# Patient Record
Sex: Female | Born: 1950 | Race: White | Hispanic: No | Marital: Married | Smoking: Never smoker
Health system: Southern US, Community
[De-identification: ages and names within clinical notes are randomized; demographics above are authoritative.]

## PROBLEM LIST (undated history)

## (undated) DIAGNOSIS — R141 Gas pain: Secondary | ICD-10-CM

## (undated) HISTORY — DX: Gas pain: R14.1

---

## 2004-12-21 ENCOUNTER — Ambulatory Visit (HOSPITAL_COMMUNITY): Admission: RE | Admit: 2004-12-21 | Discharge: 2004-12-21 | Payer: Self-pay | Admitting: Family Medicine

## 2005-03-16 ENCOUNTER — Encounter (INDEPENDENT_AMBULATORY_CARE_PROVIDER_SITE_OTHER): Payer: Self-pay | Admitting: *Deleted

## 2005-03-16 ENCOUNTER — Ambulatory Visit (HOSPITAL_COMMUNITY): Admission: RE | Admit: 2005-03-16 | Discharge: 2005-03-16 | Payer: Self-pay | Admitting: *Deleted

## 2006-03-28 ENCOUNTER — Ambulatory Visit (HOSPITAL_COMMUNITY): Admission: RE | Admit: 2006-03-28 | Discharge: 2006-03-28 | Payer: Self-pay | Admitting: Physician Assistant

## 2010-10-28 NOTE — Op Note (Signed)
Monique Patterson, Monique Patterson           ACCOUNT NO.:  [id number]   MEDICAL RECORD NO.:  [id number]          PATIENT TYPE:  AMB   LOCATION:  SDC                           FACILITY:  [facility]   PHYSICIAN:  Cornelia B. Earlene Plater, M.D.  DATE OF BIRTH:  [birth year]   DATE OF PROCEDURE:  03/16/2005  DATE OF DISCHARGE:                                 OPERATIVE REPORT   PREOPERATIVE DIAGNOSES:  Abnormal uterine bleeding, submucous myoma.   POSTOPERATIVE DIAGNOSIS:  Abnormal uterine bleeding, submucous myoma.   PROCEDURE:  Hysteroscopic resection of submucous myoma.   SURGEON:  Chester Holstein. Earlene Plater, M.D.   ASSISTANT:  None.   ANESTHESIA:  MAC and 10 mL of 1% Nesacaine paracervical block.   SPECIMENS:  Submucous fibroid.   ESTIMATED BLOOD LOSS:  Minimal.   FLUID DEFICIT:  80 mL sorbitol.   COMPLICATIONS:  None.   FINDINGS:  Approximately 1.5 cm submucous myoma, otherwise normal appearing  cavity.   INDICATIONS FOR PROCEDURE:  A patient with a history of heavy and irregular  bleeding. Ultrasound showed a focal endometrial mass. Sonohysterogram showed  a well circumscribed submucous myoma. The patient was advised of the risks  of surgery including infection, bleeding, uterine perforation, injury to  surrounding organs and fluid overload.   DESCRIPTION OF PROCEDURE:  The patient was taken to the operating room and  MAC anesthesia obtained. She was placed in the [place] stirrups, prepped and  draped in standard fashion and bladder emptied with in and out catheter.  Exam under anesthesia showed an anteverted normal size uterus with no  adnexal masses.   Speculum inserted, paracervical block placed with 10 mL 1% Nesacaine. The  anterior lip of the cervix grasped with a single tooth. The cervix was  easily dilated to a #31.   The resectoscope was inserted into the cavity after being flushed with  sorbitol. Good uterine distention was obtained. Inspection of the cavity  showed findings as outlined  above.   The submucous myoma appeared to be emanating from the anterior and right  side of the fundus. it was resected with the single loop resectoscope to its  base. Hemostasis obtained. No other intracavitary lesion was seen.   Instruments were removed and cervix hemostatic.   The patient tolerated the procedure well with no complications.      Gerri Spore B. Earlene Plater, M.D.  Electronically Signed     WBD/MEDQ  D:  03/16/2005  T:  03/16/2005  Job:  [id number]

## 2010-10-28 NOTE — Op Note (Signed)
NAMEALLANI, Monique Patterson           ACCOUNT NO.:  [id number]   MEDICAL RECORD NO.:  [id number]          PATIENT TYPE:  AMB   LOCATION:  ENDO                         FACILITY:  [facility]   PHYSICIAN:  Anselmo Rod, M.D.  DATE OF BIRTH:  11-Apr-1951   DATE OF PROCEDURE:  03/29/2006  DATE OF DISCHARGE:  03/28/2006                                 OPERATIVE REPORT   PROCEDURE:  Screening colonoscopy.   ENDOSCOPIST:  Anselmo Rod, M.D.   INSTRUMENT:  Olympus video colonoscope.   INDICATIONS FOR PROCEDURE:  A 60 year old white female with a family history  of colon cancer in a maternal aunt and a life-long history of chronic  constipation with occasional rectal bleeding undergoing a screening  colonoscopy to rule out colonic polyps, masses, etc.   PROCEDURE PREPARATION:  Informed consent was procured from the patient.  The  patient fasted for 8 hours prior to the procedure and prepped with Dulcolax  pills and a gallon of TriLyte the night prior to the procedure.  The risks  and benefits of the procedure including a 10% miss rate of cancer and polyps  were discussed with her as well.   PRE-PROCEDURE PHYSICAL:  The patient had stable vital signs.  NECK:  Supple.  CHEST:  Clear to auscultation.  S1, S2 regular.  ABDOMEN:  Soft with normal bowel sounds.   DESCRIPTION OF PROCEDURE:  The patient was placed in left lateral decubitus  position and sedated with 100 mcg fentanyl and 10 mg of Versed in slow  incremental doses.  Once the patient was adequately sedated and maintained  on low-flow oxygen and continuous cardiac monitoring, the Olympus video  colonoscope was advanced from the rectum to the cecum.  There was some  residual stool in the colon.  Multiple washes were done.  No masses, polyps,  erosions, ulcerations or diverticula were seen.  The appendiceal orifice and  ileocecal valve were visualized and photographed.  No masses, polyps,  erosions, ulcerations or diverticula were  noted.  Retroflexion in the rectum  revealed prominent internal hemorrhoids.  The patient tolerated the  procedure well without immediate complications.   IMPRESSION:  1. Prominent internal and external hemorrhoids seen on retroflexion and      inspection respectively.  Otherwise, normal exam up to the cecum.  2. No masses, polyps or diverticula noted.   RECOMMENDATIONS:  1. Continue on high fiber diet and liberal fluid intake.  Use Colace 100-      200 mg at bedtime as needed.  2. Repeat colonoscopy in the next 5 years as the patient has a family      history of colon cancer.  3. Outpatient followup as need arises in the future.  If the patient's      rectal bleeding persists, she should contact the office within the next      2 weeks.      Anselmo Rod, M.D.  Electronically Signed     JNM/MEDQ  D:  03/29/2006  T:  03/30/2006  Job:  [id number]   cc:   Gerri Spore B. Earlene Plater, M.D.

## 2017-02-19 DIAGNOSIS — K219 Gastro-esophageal reflux disease without esophagitis: Secondary | ICD-10-CM

## 2017-02-19 DIAGNOSIS — R1312 Dysphagia, oropharyngeal phase: Secondary | ICD-10-CM

## 2017-02-19 HISTORY — DX: Gastro-esophageal reflux disease without esophagitis: K21.9

## 2017-02-19 HISTORY — DX: Dysphagia, oropharyngeal phase: R13.12

## 2017-04-02 ENCOUNTER — Ambulatory Visit
Admission: RE | Admit: 2017-04-02 | Discharge: 2017-04-02 | Disposition: A | Discharge status: Home / Self Care | Payer: Medicare Other | Source: Ambulatory Visit | Attending: Family Medicine | Admitting: Family Medicine

## 2017-04-02 ENCOUNTER — Other Ambulatory Visit: Payer: Self-pay | Admitting: Family Medicine

## 2017-04-02 DIAGNOSIS — M542 Cervicalgia: Secondary | ICD-10-CM

## 2017-04-27 DIAGNOSIS — M542 Cervicalgia: Secondary | ICD-10-CM | POA: Insufficient documentation

## 2017-04-27 DIAGNOSIS — Z6827 Body mass index (BMI) 27.0-27.9, adult: Secondary | ICD-10-CM | POA: Insufficient documentation

## 2017-04-27 DIAGNOSIS — R030 Elevated blood-pressure reading, without diagnosis of hypertension: Secondary | ICD-10-CM

## 2017-04-27 HISTORY — DX: Elevated blood-pressure reading, without diagnosis of hypertension: R03.0

## 2017-04-27 HISTORY — DX: Cervicalgia: M54.2

## 2018-07-16 DIAGNOSIS — J3501 Chronic tonsillitis: Secondary | ICD-10-CM

## 2018-07-16 HISTORY — DX: Chronic tonsillitis: J35.01

## 2019-04-17 ENCOUNTER — Other Ambulatory Visit: Payer: Self-pay

## 2019-04-17 DIAGNOSIS — Z20822 Contact with and (suspected) exposure to covid-19: Secondary | ICD-10-CM

## 2019-04-18 LAB — NOVEL CORONAVIRUS, NAA: SARS-CoV-2, NAA: NOT DETECTED

## 2019-09-08 ENCOUNTER — Telehealth: Payer: Self-pay

## 2019-09-08 NOTE — Telephone Encounter (Signed)
Pt called to cancel both appts on 09/29/19 & fu appt on 10/06/19 [place]. Pt states if she decides to continue she will call to reschedule.

## 2019-09-29 ENCOUNTER — Encounter: Payer: Medicare Other | Admitting: Psychology

## 2019-10-01 DIAGNOSIS — Z1231 Encounter for screening mammogram for malignant neoplasm of breast: Secondary | ICD-10-CM | POA: Diagnosis not present

## 2019-10-06 ENCOUNTER — Encounter: Payer: Medicare Other | Admitting: Psychology

## 2019-11-13 DIAGNOSIS — E782 Mixed hyperlipidemia: Secondary | ICD-10-CM | POA: Diagnosis not present

## 2019-11-13 DIAGNOSIS — R413 Other amnesia: Secondary | ICD-10-CM | POA: Diagnosis not present

## 2019-11-13 DIAGNOSIS — Z0000 Encounter for general adult medical examination without abnormal findings: Secondary | ICD-10-CM | POA: Diagnosis not present

## 2019-11-13 DIAGNOSIS — K5909 Other constipation: Secondary | ICD-10-CM | POA: Diagnosis not present

## 2019-11-17 DIAGNOSIS — E782 Mixed hyperlipidemia: Secondary | ICD-10-CM | POA: Diagnosis not present

## 2019-11-17 DIAGNOSIS — R413 Other amnesia: Secondary | ICD-10-CM | POA: Diagnosis not present

## 2019-11-17 DIAGNOSIS — H9190 Unspecified hearing loss, unspecified ear: Secondary | ICD-10-CM | POA: Diagnosis not present

## 2019-11-20 ENCOUNTER — Encounter: Payer: Self-pay | Admitting: Psychology

## 2019-12-22 ENCOUNTER — Encounter: Payer: Medicare Other | Admitting: Psychology

## 2019-12-22 DIAGNOSIS — R413 Other amnesia: Secondary | ICD-10-CM | POA: Diagnosis not present

## 2019-12-30 ENCOUNTER — Encounter: Payer: Medicare Other | Admitting: Psychology

## 2020-01-01 DIAGNOSIS — F3342 Major depressive disorder, recurrent, in full remission: Secondary | ICD-10-CM | POA: Diagnosis not present

## 2020-01-01 DIAGNOSIS — Z6827 Body mass index (BMI) 27.0-27.9, adult: Secondary | ICD-10-CM | POA: Diagnosis not present

## 2020-01-01 DIAGNOSIS — R413 Other amnesia: Secondary | ICD-10-CM | POA: Diagnosis not present

## 2020-01-08 DIAGNOSIS — M5136 Other intervertebral disc degeneration, lumbar region: Secondary | ICD-10-CM | POA: Diagnosis not present

## 2020-01-08 DIAGNOSIS — M9903 Segmental and somatic dysfunction of lumbar region: Secondary | ICD-10-CM | POA: Diagnosis not present

## 2020-01-12 DIAGNOSIS — M5136 Other intervertebral disc degeneration, lumbar region: Secondary | ICD-10-CM | POA: Diagnosis not present

## 2020-01-12 DIAGNOSIS — M9903 Segmental and somatic dysfunction of lumbar region: Secondary | ICD-10-CM | POA: Diagnosis not present

## 2020-01-13 DIAGNOSIS — M5136 Other intervertebral disc degeneration, lumbar region: Secondary | ICD-10-CM | POA: Diagnosis not present

## 2020-01-13 DIAGNOSIS — M9903 Segmental and somatic dysfunction of lumbar region: Secondary | ICD-10-CM | POA: Diagnosis not present

## 2020-01-19 DIAGNOSIS — M9903 Segmental and somatic dysfunction of lumbar region: Secondary | ICD-10-CM | POA: Diagnosis not present

## 2020-01-19 DIAGNOSIS — M5136 Other intervertebral disc degeneration, lumbar region: Secondary | ICD-10-CM | POA: Diagnosis not present

## 2020-01-20 DIAGNOSIS — H7291 Unspecified perforation of tympanic membrane, right ear: Secondary | ICD-10-CM | POA: Diagnosis not present

## 2020-01-20 DIAGNOSIS — Z77122 Contact with and (suspected) exposure to noise: Secondary | ICD-10-CM | POA: Diagnosis not present

## 2020-01-20 DIAGNOSIS — Z822 Family history of deafness and hearing loss: Secondary | ICD-10-CM | POA: Diagnosis not present

## 2020-01-20 DIAGNOSIS — H903 Sensorineural hearing loss, bilateral: Secondary | ICD-10-CM | POA: Diagnosis not present

## 2020-01-21 DIAGNOSIS — M5136 Other intervertebral disc degeneration, lumbar region: Secondary | ICD-10-CM | POA: Diagnosis not present

## 2020-01-21 DIAGNOSIS — M9903 Segmental and somatic dysfunction of lumbar region: Secondary | ICD-10-CM | POA: Diagnosis not present

## 2020-01-26 DIAGNOSIS — M9903 Segmental and somatic dysfunction of lumbar region: Secondary | ICD-10-CM | POA: Diagnosis not present

## 2020-01-26 DIAGNOSIS — M5136 Other intervertebral disc degeneration, lumbar region: Secondary | ICD-10-CM | POA: Diagnosis not present

## 2020-02-02 DIAGNOSIS — M9903 Segmental and somatic dysfunction of lumbar region: Secondary | ICD-10-CM | POA: Diagnosis not present

## 2020-02-02 DIAGNOSIS — M5136 Other intervertebral disc degeneration, lumbar region: Secondary | ICD-10-CM | POA: Diagnosis not present

## 2020-02-09 DIAGNOSIS — M9903 Segmental and somatic dysfunction of lumbar region: Secondary | ICD-10-CM | POA: Diagnosis not present

## 2020-02-09 DIAGNOSIS — M5136 Other intervertebral disc degeneration, lumbar region: Secondary | ICD-10-CM | POA: Diagnosis not present

## 2020-02-17 DIAGNOSIS — R413 Other amnesia: Secondary | ICD-10-CM | POA: Diagnosis not present

## 2020-02-17 DIAGNOSIS — Z23 Encounter for immunization: Secondary | ICD-10-CM | POA: Diagnosis not present

## 2020-02-17 DIAGNOSIS — F419 Anxiety disorder, unspecified: Secondary | ICD-10-CM | POA: Diagnosis not present

## 2020-02-17 DIAGNOSIS — F3342 Major depressive disorder, recurrent, in full remission: Secondary | ICD-10-CM | POA: Diagnosis not present

## 2020-03-04 DIAGNOSIS — R413 Other amnesia: Secondary | ICD-10-CM | POA: Diagnosis not present

## 2020-03-04 DIAGNOSIS — Z6826 Body mass index (BMI) 26.0-26.9, adult: Secondary | ICD-10-CM | POA: Diagnosis not present

## 2020-03-04 DIAGNOSIS — F3342 Major depressive disorder, recurrent, in full remission: Secondary | ICD-10-CM | POA: Diagnosis not present

## 2020-03-25 DIAGNOSIS — H903 Sensorineural hearing loss, bilateral: Secondary | ICD-10-CM | POA: Diagnosis not present

## 2020-04-07 DIAGNOSIS — Z6827 Body mass index (BMI) 27.0-27.9, adult: Secondary | ICD-10-CM | POA: Diagnosis not present

## 2020-04-07 DIAGNOSIS — R413 Other amnesia: Secondary | ICD-10-CM | POA: Diagnosis not present

## 2020-04-07 DIAGNOSIS — F3342 Major depressive disorder, recurrent, in full remission: Secondary | ICD-10-CM | POA: Diagnosis not present

## 2020-05-12 DIAGNOSIS — F3342 Major depressive disorder, recurrent, in full remission: Secondary | ICD-10-CM | POA: Diagnosis not present

## 2020-05-12 DIAGNOSIS — R413 Other amnesia: Secondary | ICD-10-CM | POA: Diagnosis not present

## 2020-05-12 DIAGNOSIS — Z6827 Body mass index (BMI) 27.0-27.9, adult: Secondary | ICD-10-CM | POA: Diagnosis not present

## 2020-07-26 DIAGNOSIS — H9190 Unspecified hearing loss, unspecified ear: Secondary | ICD-10-CM | POA: Diagnosis not present

## 2020-07-26 DIAGNOSIS — Z6826 Body mass index (BMI) 26.0-26.9, adult: Secondary | ICD-10-CM | POA: Diagnosis not present

## 2020-07-26 DIAGNOSIS — Z79899 Other long term (current) drug therapy: Secondary | ICD-10-CM | POA: Diagnosis not present

## 2020-07-26 DIAGNOSIS — E559 Vitamin D deficiency, unspecified: Secondary | ICD-10-CM | POA: Diagnosis not present

## 2020-07-26 DIAGNOSIS — F3342 Major depressive disorder, recurrent, in full remission: Secondary | ICD-10-CM | POA: Diagnosis not present

## 2020-07-26 DIAGNOSIS — R413 Other amnesia: Secondary | ICD-10-CM | POA: Diagnosis not present

## 2020-07-26 DIAGNOSIS — E782 Mixed hyperlipidemia: Secondary | ICD-10-CM | POA: Diagnosis not present

## 2020-07-30 DIAGNOSIS — Z0000 Encounter for general adult medical examination without abnormal findings: Secondary | ICD-10-CM | POA: Diagnosis not present

## 2020-07-30 DIAGNOSIS — Z1389 Encounter for screening for other disorder: Secondary | ICD-10-CM | POA: Diagnosis not present

## 2020-08-05 DIAGNOSIS — E559 Vitamin D deficiency, unspecified: Secondary | ICD-10-CM | POA: Diagnosis not present

## 2020-08-05 DIAGNOSIS — Z6827 Body mass index (BMI) 27.0-27.9, adult: Secondary | ICD-10-CM | POA: Diagnosis not present

## 2020-08-05 DIAGNOSIS — F3342 Major depressive disorder, recurrent, in full remission: Secondary | ICD-10-CM | POA: Diagnosis not present

## 2020-08-05 DIAGNOSIS — R413 Other amnesia: Secondary | ICD-10-CM | POA: Diagnosis not present

## 2020-08-05 DIAGNOSIS — N1831 Chronic kidney disease, stage 3a: Secondary | ICD-10-CM | POA: Diagnosis not present

## 2020-10-21 DIAGNOSIS — H5203 Hypermetropia, bilateral: Secondary | ICD-10-CM | POA: Diagnosis not present

## 2020-10-21 DIAGNOSIS — H2513 Age-related nuclear cataract, bilateral: Secondary | ICD-10-CM | POA: Diagnosis not present

## 2020-12-03 DIAGNOSIS — H9190 Unspecified hearing loss, unspecified ear: Secondary | ICD-10-CM | POA: Insufficient documentation

## 2020-12-03 DIAGNOSIS — F419 Anxiety disorder, unspecified: Secondary | ICD-10-CM | POA: Insufficient documentation

## 2020-12-03 DIAGNOSIS — K5100 Ulcerative (chronic) pancolitis without complications: Secondary | ICD-10-CM

## 2020-12-03 DIAGNOSIS — F3342 Major depressive disorder, recurrent, in full remission: Secondary | ICD-10-CM

## 2020-12-03 DIAGNOSIS — F411 Generalized anxiety disorder: Secondary | ICD-10-CM | POA: Insufficient documentation

## 2020-12-03 DIAGNOSIS — E782 Mixed hyperlipidemia: Secondary | ICD-10-CM | POA: Insufficient documentation

## 2020-12-03 DIAGNOSIS — E559 Vitamin D deficiency, unspecified: Secondary | ICD-10-CM

## 2020-12-03 DIAGNOSIS — M47812 Spondylosis without myelopathy or radiculopathy, cervical region: Secondary | ICD-10-CM

## 2020-12-03 DIAGNOSIS — N1831 Chronic kidney disease, stage 3a: Secondary | ICD-10-CM

## 2020-12-03 DIAGNOSIS — K5900 Constipation, unspecified: Secondary | ICD-10-CM | POA: Insufficient documentation

## 2020-12-03 HISTORY — DX: Mixed hyperlipidemia: E78.2

## 2020-12-03 HISTORY — DX: Ulcerative (chronic) pancolitis without complications: K51.00

## 2020-12-03 HISTORY — DX: Major depressive disorder, recurrent, in full remission: F33.42

## 2020-12-03 HISTORY — DX: Spondylosis without myelopathy or radiculopathy, cervical region: M47.812

## 2020-12-03 HISTORY — DX: Unspecified hearing loss, unspecified ear: H91.90

## 2020-12-03 HISTORY — DX: Chronic kidney disease, stage 3a: N18.31

## 2020-12-03 HISTORY — DX: Vitamin D deficiency, unspecified: E55.9

## 2021-01-11 DIAGNOSIS — S0990XA Unspecified injury of head, initial encounter: Secondary | ICD-10-CM | POA: Diagnosis not present

## 2021-01-14 ENCOUNTER — Other Ambulatory Visit: Payer: Self-pay | Admitting: Family Medicine

## 2021-01-14 DIAGNOSIS — S0990XA Unspecified injury of head, initial encounter: Secondary | ICD-10-CM

## 2021-02-01 ENCOUNTER — Ambulatory Visit
Admission: RE | Admit: 2021-02-01 | Discharge: 2021-02-01 | Disposition: A | Discharge status: Home / Self Care | Payer: Medicare PPO | Source: Ambulatory Visit | Attending: Family Medicine | Admitting: Family Medicine

## 2021-02-01 DIAGNOSIS — R519 Headache, unspecified: Secondary | ICD-10-CM | POA: Diagnosis not present

## 2021-02-01 DIAGNOSIS — S0990XA Unspecified injury of head, initial encounter: Secondary | ICD-10-CM

## 2021-02-15 DIAGNOSIS — R21 Rash and other nonspecific skin eruption: Secondary | ICD-10-CM | POA: Diagnosis not present

## 2021-03-23 DIAGNOSIS — M546 Pain in thoracic spine: Secondary | ICD-10-CM | POA: Diagnosis not present

## 2021-04-06 DIAGNOSIS — R519 Headache, unspecified: Secondary | ICD-10-CM | POA: Diagnosis not present

## 2021-04-18 DIAGNOSIS — M546 Pain in thoracic spine: Secondary | ICD-10-CM | POA: Diagnosis not present

## 2021-04-18 DIAGNOSIS — M542 Cervicalgia: Secondary | ICD-10-CM | POA: Diagnosis not present

## 2021-04-18 DIAGNOSIS — M549 Dorsalgia, unspecified: Secondary | ICD-10-CM | POA: Diagnosis not present

## 2021-04-18 DIAGNOSIS — R413 Other amnesia: Secondary | ICD-10-CM | POA: Diagnosis not present

## 2021-04-27 DIAGNOSIS — H905 Unspecified sensorineural hearing loss: Secondary | ICD-10-CM | POA: Diagnosis not present

## 2021-04-27 DIAGNOSIS — H9071 Mixed conductive and sensorineural hearing loss, unilateral, right ear, with unrestricted hearing on the contralateral side: Secondary | ICD-10-CM | POA: Diagnosis not present

## 2021-08-16 DIAGNOSIS — F419 Anxiety disorder, unspecified: Secondary | ICD-10-CM | POA: Diagnosis not present

## 2021-08-16 DIAGNOSIS — E559 Vitamin D deficiency, unspecified: Secondary | ICD-10-CM | POA: Diagnosis not present

## 2021-08-16 DIAGNOSIS — E782 Mixed hyperlipidemia: Secondary | ICD-10-CM | POA: Diagnosis not present

## 2021-08-16 DIAGNOSIS — Z0000 Encounter for general adult medical examination without abnormal findings: Secondary | ICD-10-CM | POA: Diagnosis not present

## 2021-08-16 DIAGNOSIS — N1831 Chronic kidney disease, stage 3a: Secondary | ICD-10-CM | POA: Diagnosis not present

## 2021-08-16 DIAGNOSIS — Z1389 Encounter for screening for other disorder: Secondary | ICD-10-CM | POA: Diagnosis not present

## 2021-08-22 DIAGNOSIS — E782 Mixed hyperlipidemia: Secondary | ICD-10-CM | POA: Diagnosis not present

## 2021-08-22 DIAGNOSIS — F0390 Unspecified dementia without behavioral disturbance: Secondary | ICD-10-CM | POA: Diagnosis not present

## 2021-08-22 DIAGNOSIS — Z23 Encounter for immunization: Secondary | ICD-10-CM | POA: Diagnosis not present

## 2021-08-22 DIAGNOSIS — F419 Anxiety disorder, unspecified: Secondary | ICD-10-CM | POA: Diagnosis not present

## 2021-08-22 DIAGNOSIS — R413 Other amnesia: Secondary | ICD-10-CM | POA: Diagnosis not present

## 2021-10-27 DIAGNOSIS — H2513 Age-related nuclear cataract, bilateral: Secondary | ICD-10-CM | POA: Diagnosis not present

## 2021-10-27 DIAGNOSIS — L821 Other seborrheic keratosis: Secondary | ICD-10-CM | POA: Diagnosis not present

## 2021-10-27 DIAGNOSIS — D225 Melanocytic nevi of trunk: Secondary | ICD-10-CM | POA: Diagnosis not present

## 2021-10-27 DIAGNOSIS — H5203 Hypermetropia, bilateral: Secondary | ICD-10-CM | POA: Diagnosis not present

## 2021-10-27 DIAGNOSIS — D485 Neoplasm of uncertain behavior of skin: Secondary | ICD-10-CM | POA: Diagnosis not present

## 2021-10-27 DIAGNOSIS — H524 Presbyopia: Secondary | ICD-10-CM | POA: Diagnosis not present

## 2021-11-16 DIAGNOSIS — L814 Other melanin hyperpigmentation: Secondary | ICD-10-CM | POA: Diagnosis not present

## 2021-11-16 DIAGNOSIS — L821 Other seborrheic keratosis: Secondary | ICD-10-CM | POA: Diagnosis not present

## 2021-11-16 DIAGNOSIS — D1801 Hemangioma of skin and subcutaneous tissue: Secondary | ICD-10-CM | POA: Diagnosis not present

## 2021-12-26 DIAGNOSIS — F3342 Major depressive disorder, recurrent, in full remission: Secondary | ICD-10-CM | POA: Diagnosis not present

## 2021-12-26 DIAGNOSIS — E782 Mixed hyperlipidemia: Secondary | ICD-10-CM | POA: Diagnosis not present

## 2021-12-26 DIAGNOSIS — F0390 Unspecified dementia without behavioral disturbance: Secondary | ICD-10-CM | POA: Diagnosis not present

## 2022-03-22 DIAGNOSIS — R1013 Epigastric pain: Secondary | ICD-10-CM | POA: Diagnosis not present

## 2022-04-19 ENCOUNTER — Ambulatory Visit: Payer: Medicare PPO | Admitting: Physician Assistant

## 2022-04-19 ENCOUNTER — Encounter: Payer: Self-pay | Admitting: Physician Assistant

## 2022-04-19 VITALS — BP 145/82 | HR 90 | Resp 18 | Ht 64.0 in | Wt 143.0 lb

## 2022-04-19 DIAGNOSIS — G3184 Mild cognitive impairment, so stated: Secondary | ICD-10-CM

## 2022-04-19 DIAGNOSIS — R413 Other amnesia: Secondary | ICD-10-CM | POA: Diagnosis not present

## 2022-04-19 NOTE — Patient Instructions (Addendum)
It was a pleasure to see you today at our office.   Recommendations:  Neurocognitive evaluation at our office MRI of the brain, the radiology office will call you to arrange you appointment Recommend Psychotherapy   Follow up in 1 year November 5 at 11:30   Whom to call:  Memory  decline, memory medications: Call our office [phone]   For psychiatric meds, mood meds: Please have your primary care physician manage these medications.   Counseling regarding caregiver distress, including caregiver depression, anxiety and issues regarding community resources, adult day care programs, adult living facilities, or memory care questions:   Feel free to contact Misty Lisabeth Register, Social Worker at [phone]   For assessment of decision of mental capacity and competency:  Call Dr. Erick Blinks, geriatric psychiatrist at [phone]  For guidance in geriatric dementia issues please call [facility] [phone]  For guidance regarding [facility] and if placement were needed at the facility, contact Sidney Ace, Social Worker tel: [phone]  If you have any severe symptoms of a stroke, or other severe issues such as confusion,severe chills or fever, etc call 911 or go to the ER as you may need to be evaluated further   Feel free to visit Facebook page " [email or web address]" for tips of how to care for people with memory problems.      RECOMMENDATIONS FOR ALL PATIENTS WITH MEMORY PROBLEMS: 1. Continue to exercise (Recommend 30 minutes of walking everyday, or 3 hours every week) 2. Increase social interactions - continue going to [place] and enjoy social gatherings with friends and family 3. Eat healthy, avoid fried foods and eat more fruits and vegetables 4. Maintain adequate blood pressure, blood sugar, and blood cholesterol level. Reducing the risk of stroke and cardiovascular disease also helps promoting better memory. 5. Avoid stressful situations. Live a  simple life and avoid aggravations. Organize your time and prepare for the next day in anticipation. 6. Sleep well, avoid any interruptions of sleep and avoid any distractions in the bedroom that may interfere with adequate sleep quality 7. Avoid sugar, avoid sweets as there is a strong link between excessive sugar intake, diabetes, and cognitive impairment We discussed the Mediterranean diet, which has been shown to help patients reduce the risk of progressive memory disorders and reduces cardiovascular risk. This includes eating fish, eat fruits and green leafy vegetables, nuts like almonds and hazelnuts, walnuts, and also use olive oil. Avoid fast foods and fried foods as much as possible. Avoid sweets and sugar as sugar use has been linked to worsening of memory function.  There is always a concern of gradual progression of memory problems. If this is the case, then we may need to adjust level of care according to patient needs. Support, both to the patient and caregiver, should then be put into place.      You have been referred for a neuropsychological evaluation (i.e., evaluation of memory and thinking abilities). Please bring someone with you to this appointment if possible, as it is helpful for the doctor to hear from both you and another adult who knows you well. Please bring eyeglasses and hearing aids if you wear them.    The evaluation will take approximately 3 hours and has two parts:   The first part is a clinical interview with the neuropsychologist (Dr. Milbert Coulter or Dr. Roseanne Reno). During the interview, the neuropsychologist will speak with you and the individual you brought to the appointment.    The second part  of the evaluation is testing with the doctor's technician Hinton Dyer or Maudie Mercury). During the testing, the technician will ask you to remember different types of material, solve problems, and answer some questionnaires. Your family member will not be present for this portion of the  evaluation.   Please note: We must reserve several hours of the neuropsychologist's time and the psychometrician's time for your evaluation appointment. As such, there is a No-Show fee of $100. If you are unable to attend any of your appointments, please contact our office as soon as possible to reschedule.    FALL PRECAUTIONS: Be cautious when walking. Scan the area for obstacles that may increase the risk of trips and falls. When getting up in the mornings, sit up at the edge of the bed for a few minutes before getting out of bed. Consider elevating the bed at the head end to avoid drop of blood pressure when getting up. Walk always in a well-lit room (use night lights in the walls). Avoid area rugs or power cords from appliances in the middle of the walkways. Use a walker or a cane if necessary and consider physical therapy for balance exercise. Get your eyesight checked regularly.  FINANCIAL OVERSIGHT: Supervision, especially oversight when making financial decisions or transactions is also recommended.  HOME SAFETY: Consider the safety of the kitchen when operating appliances like stoves, microwave oven, and blender. Consider having supervision and share cooking responsibilities until no longer able to participate in those. Accidents with firearms and other hazards in the house should be identified and addressed as well.   ABILITY TO BE LEFT ALONE: If patient is unable to contact 911 operator, consider using LifeLine, or when the need is there, arrange for someone to stay with patients. Smoking is a fire hazard, consider supervision or cessation. Risk of wandering should be assessed by caregiver and if detected at any point, supervision and safe proof recommendations should be instituted.  MEDICATION SUPERVISION: Inability to self-administer medication needs to be constantly addressed. Implement a mechanism to ensure safe administration of the medications.   DRIVING: Regarding driving, in  patients with progressive memory problems, driving will be impaired. We advise to have someone else do the driving if trouble finding directions or if minor accidents are reported. Independent driving assessment is available to determine safety of driving.   If you are interested in the driving assessment, you can contact the following:  [employer] in [place]  [place] [phone] or [phone]    [place] refers to food and lifestyle choices that are based on the traditions of countries located on the [employer]. This way of eating has been shown to help prevent certain conditions and improve outcomes for people who have chronic diseases, like kidney disease and heart disease. What are tips for following this plan? Lifestyle  Cook and eat meals together with your family, when possible. Drink enough fluid to keep your urine clear or pale yellow. Be physically active every day. This includes: Aerobic exercise like running or swimming. Leisure activities like gardening, walking, or housework. Get 7-8 hours of sleep each night. If recommended by your health care provider, drink red wine in moderation. This means 1 glass a day for nonpregnant women and 2 glasses a day for men. A glass of wine equals 5 oz (150 mL). Reading food labels  Check the serving size of packaged foods. For foods such as  rice and pasta, the serving size refers to the amount of cooked product, not dry. Check the total fat in packaged foods. Avoid foods that have saturated fat or trans fats. Check the ingredients list for added sugars, such as corn syrup. Shopping  At the grocery store, buy most of your food from the areas near the walls of the store. This includes: Fresh fruits and vegetables (produce). Grains, beans, nuts, and seeds. Some of these may be  available in unpackaged forms or large amounts (in bulk). Fresh seafood. Poultry and eggs. Low-fat dairy products. Buy whole ingredients instead of prepackaged foods. Buy fresh fruits and vegetables in-season from local farmers markets. Buy frozen fruits and vegetables in resealable bags. If you do not have access to quality fresh seafood, buy precooked frozen shrimp or canned fish, such as tuna, salmon, or sardines. Buy small amounts of raw or cooked vegetables, salads, or olives from the deli or salad bar at your store. Stock your pantry so you always have certain foods on hand, such as olive oil, canned tuna, canned tomatoes, rice, pasta, and beans. Cooking  Cook foods with extra-virgin olive oil instead of using butter or other vegetable oils. Have meat as a side dish, and have vegetables or grains as your main dish. This means having meat in small portions or adding small amounts of meat to foods like pasta or stew. Use beans or vegetables instead of meat in common dishes like chili or lasagna. Experiment with different cooking methods. Try roasting or broiling vegetables instead of steaming or sauteing them. Add frozen vegetables to soups, stews, pasta, or rice. Add nuts or seeds for added healthy fat at each meal. You can add these to yogurt, salads, or vegetable dishes. Marinate fish or vegetables using olive oil, lemon juice, garlic, and fresh herbs. Meal planning  Plan to eat 1 vegetarian meal one day each week. Try to work up to 2 vegetarian meals, if possible. Eat seafood 2 or more times a week. Have healthy snacks readily available, such as: Vegetable sticks with hummus. Greek yogurt. Fruit and nut trail mix. Eat balanced meals throughout the week. This includes: Fruit: 2-3 servings a day Vegetables: 4-5 servings a day Low-fat dairy: 2 servings a day Fish, poultry, or lean meat: 1 serving a day Beans and legumes: 2 or more servings a week Nuts and seeds: 1-2 servings a  day Whole grains: 6-8 servings a day Extra-virgin olive oil: 3-4 servings a day Limit red meat and sweets to only a few servings a month What are my food choices? Mediterranean diet Recommended Grains: Whole-grain pasta. Brown rice. Bulgar wheat. Polenta. Couscous. Whole-wheat bread. [place]. Vegetables: Artichokes. Beets. Broccoli. Cabbage. Carrots. Eggplant. Green beans. Chard. Kale. Spinach. Onions. Leeks. Peas. Squash. Tomatoes. Peppers. Radishes. Fruits: Apples. Apricots. Avocado. Berries. Bananas. Cherries. Dates. Figs. Grapes. Lemons. Melon. Oranges. Peaches. Plums. Pomegranate. Meats and other protein foods: Beans. Almonds. Sunflower seeds. Pine nuts. Peanuts. [place]. Salmon. Scallops. Shrimp. [place]. Tilapia. Clams. Oysters. Eggs. Dairy: Low-fat milk. Cheese. Greek yogurt. Beverages: Water. Red wine. Herbal tea. Fats and oils: Extra virgin olive oil. Avocado oil. Grape seed oil. Sweets and desserts: [place] yogurt with honey. Baked apples. Poached pears. Trail mix. Seasoning and other foods: Basil. Cilantro. Coriander. Cumin. Mint. Parsley. Sage. Rosemary. Tarragon. Garlic. Oregano. Thyme. Pepper. Balsalmic vinegar. Tahini. Hummus. Tomato sauce. Olives. Mushrooms. Limit these Grains: Prepackaged pasta or rice dishes. Prepackaged cereal with added sugar. Vegetables: Deep fried potatoes (french fries). Fruits: Fruit canned in syrup. Meats and  other protein foods: Beef. Pork. Lamb. Poultry with skin. Hot dogs. [place] Salines. Dairy: Ice cream. Sour cream. Whole milk. Beverages: Juice. Sugar-sweetened soft drinks. Beer. Liquor and spirits. Fats and oils: Butter. Canola oil. Vegetable oil. Beef fat (tallow). Lard. Sweets and desserts: Cookies. Cakes. Pies. Candy. Seasoning and other foods: Mayonnaise. Premade sauces and marinades. The items listed may not be a complete list. Talk with your dietitian about what dietary choices are right for you. Summary The Mediterranean diet includes both  food and lifestyle choices. Eat a variety of fresh fruits and vegetables, beans, nuts, seeds, and whole grains. Limit the amount of red meat and sweets that you eat. Talk with your health care provider about whether it is safe for you to drink red wine in moderation. This means 1 glass a day for nonpregnant women and 2 glasses a day for men. A glass of wine equals 5 oz (150 mL). This information is not intended to replace advice given to you by your health care provider. Make sure you discuss any questions you have with your health care provider. Document Released: 01/20/2016 Document Revised: 02/22/2016 Document Reviewed: 01/20/2016 Elsevier Interactive Patient Education  2017 [employer].

## 2022-04-19 NOTE — Progress Notes (Addendum)
Assessment/Plan:    The patient is seen in neurologic consultation at the request of Kathyrn Lass, MD for the evaluation of memory.  Monique Patterson is a very pleasant 71 y.o. year old RH female with  a history of anxiety, depression, hearing loss,  prior history of ulcerative colitis,  seen today for evaluation of memory loss.  Patient has significant anxiety during the testing, which may have affected some of the items.  MoCA today is 20/30 .  During this visit, the patient is quite eloquent, able to engage in a conversation although at times, tangential behavior is noted.Personally reviewed the CT of the head on Nov 01, 2020 and is negative for acute intracranial abnormalities, normal volume for age.  In the past, the patient had being given donepezil, which she was unable to tolerate, due to significant nightmares.  Patient and daughter are in agreement that any other dementia medications are to be entertained when the diagnosis is obtained that warrants the need for it.   Mild cognitive impairment of unclear etiology.  MRI brain without contrast to assess for underlying structural abnormality and assess vascular load  Neurocognitive testing to further evaluate cognitive concerns and determine other underlying cause of memory changes, including potential contribution from sleep, anxiety, or depression  Continue to control mood as per PCP and [facility]ow up  1 year or earlier if needed  Subjective:    The patient is accompanied by her daughter who lives in [place] and is visiting her who supplements the history.    How long did patient have memory difficulties?  Patient had an MMSE performed by her PCP on April of this year yielding a score of 24/30. Daughter report that she tried donepezil but developed nightmares so she held off.  Patient denies any significant changes in her memory, and instead, she does report that after COVID pandemic, she is still trying to adjust and socialize as  before.  At this time, she likes to go to church, enjoys pursuing, likes to do crossword puzzles and word finding.  Her daughter reports that she is "slow at getting back to do things that give her joy "  Repeats oneself? Denies but daughter reports that occasionally repeats herself Disoriented when walking into a room?  Patient denies   Leaving objects in unusual places?  Patient denies   Patient lives husband.   Ambulates  with difficulty?  Does Silver Sneakers  Recent falls?  Patient denies   Any head injuries?  She fell in the bathroom 2 y ago and her CT was normal.  No other head injuries or major falls.  No loss of. History of seizures?   Patient denies   Wandering behavior?  Patient denies   Patient drives? No issues Any mood changes ?  Daughter reports that she is more withdrawn, reclusive, and she relies more on physical than before.  At times, she lashes at her kids, and this has been difficult for her daughter and son, because it is "not like her".   Any depression?:  She has a history of anxiety and depression, not currently behavioral therapy, but she is on Effexor. Lost her parents and other people during the last 2 years which has been hard for her.  She is planning to go back to behavioral therapy which may be of help. Hallucinations?  Patient denies   Paranoia?  Patient denies, but anxious about things.  Patient's mother "braced her with the worse case scenario, and this  has taken a toll on her fears   " Patient reports that sleeps well without vivid dreams, REM behavior or sleepwalking. Since off Prozac she is doing well  History of sleep apnea?  Patient denies   Any hygiene concerns?  Patient denies   Independent of bathing and dressing?  Endorsed  Does the patient needs help with medications? Patient in charge with pillbox   Who is in charge of the finances?  Patient is in charge   Any changes in appetite?  Patient denies. She skips breakfast when she is angry.    Patient  have trouble swallowing? 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  Any kitchen accidents such as leaving the stove on? Patient denies   Any headaches?  Patient denies   Double vision? Patient denies   Any focal numbness or tingling?  Patient denies   Chronic back pain Patient denies   Unilateral weakness?  Patient denies   Any tremors?  Patient denies   Any history of anosmia?  Patient denies   Any incontinence of urine?  Patient denies   Any bowel dysfunction?   Patient denies   History of heavy alcohol intake?  Patient denies   History of heavy tobacco use?  Patient denies   Family history of dementia?   Mo 96  with dementia? type  Not on File  Current Outpatient Medications  Medication Instructions   venlafaxine XR (EFFEXOR XR) 37.5 mg, Takes 3 tabs daily     VITALS:   Vitals:   04/19/22 1000  BP: (!) 145/82  Pulse: 90  Resp: 18  SpO2: 99%  Weight: 143 lb (64.9 kg)  Height: 5\' 4"  (1.626 m)       No data to display          PHYSICAL EXAM   HEENT:  Normocephalic, atraumatic. The mucous membranes are moist. The superficial temporal arteries are without ropiness or tenderness. Cardiovascular: Regular rate and rhythm. Lungs: Clear to auscultation bilaterally. Neck: There are no carotid bruits noted bilaterally.  NEUROLOGICAL:    04/19/2022   10:00 AM  Montreal Cognitive Assessment   Visuospatial/ Executive (0/5) 4  Naming (0/3) 3  Attention: Read list of digits (0/2) 2  Attention: Read list of letters (0/1) 1  Attention: Serial 7 subtraction starting at 100 (0/3) 3  Language: Repeat phrase (0/2) 1  Language : Fluency (0/1) 1  Abstraction (0/2) 1  Delayed Recall (0/5) 0  Orientation (0/6) 4  Total 20  Adjusted Score (based on education) 20        No data to display           Orientation:  Alert and oriented to person, place and not to date. No aphasia or dysarthria. Fund of knowledge is appropriate. Recent memory impaired  and remote memory intact.  Attention and concentration are normal.  Able to name objects and repeat phrases. Delayed recall 0/3  Cranial nerves: There is good facial symmetry. Extraocular muscles are intact and visual fields are full to confrontational testing. Speech is tangential but fluent and clear. Hearing is decreased to conversational tone. Tone: Tone is good throughout.  Coordination: The patient has no difficulty with RAM's or FNF bilaterally. Normal finger to nose  Motor: Strength is 5/5 in the bilateral upper and lower extremities. There is no pronator drift. There are no fasciculations noted. DTR's: Deep tendon reflexes are 2/4 at the bilateral biceps, triceps, brachioradialis, patella and achilles.   Gait  and Station: The patient is able to ambulate without difficulty.The patient is able to ambulate in a tandem fashion, able to stand without difficulty      Thank you for allowing [place] the opportunity to participate in the care of this nice patient. Please do not hesitate to contact us for any questions or concerns.   Total time spent on today's visit was 60 minutes dedicated to this patient today, preparing to see patient, examining the patient, ordering tests and/or medications and counseling the patient, documenting clinical information in the EHR or other health record, independently interpreting results and communicating results to the patient/family, discussing treatment and goals, answering patient's questions and coordinating care.  Cc:  Sigmund Hazel, MD  Marlowe Kays 04/19/2022 12:22 PM

## 2022-04-28 DIAGNOSIS — R413 Other amnesia: Secondary | ICD-10-CM | POA: Diagnosis not present

## 2022-04-28 DIAGNOSIS — F419 Anxiety disorder, unspecified: Secondary | ICD-10-CM | POA: Diagnosis not present

## 2022-05-09 ENCOUNTER — Ambulatory Visit: Payer: Medicare PPO | Admitting: Psychiatry

## 2022-05-09 ENCOUNTER — Ambulatory Visit: Payer: Self-pay | Admitting: Psychiatry

## 2022-05-09 DIAGNOSIS — F411 Generalized anxiety disorder: Secondary | ICD-10-CM

## 2022-05-09 NOTE — Progress Notes (Signed)
[facility] Counsel[facility] Initial Adult Exam  Name: Monique Patterson Date: 05/09/2022 MRN: [id number] DOB: 09-12-1950 PCP: Kathyrn Lass, MD  Time spent: 60 minutes  Guardian/Payee:  patient    Paperwork requested:  No   Reason for Visit /Presenting Problem: depression, anxiety (states anxiety is strongest and is often related to adult daughter)  Mental Status Exam:    Appearance:   Casual and Neat     Behavior:  Appropriate, Sharing, and Motivated  Motor:  Normal  Speech/Language:   Clear and Coherent  Affect:  Depressed and anxious  Mood:  anxious and depressed  Thought process:  goal directed  Thought content:    Rumination  Sensory/Perceptual disturbances:    WNL  Orientation:  oriented to person, place, time/date, situation, day of week, month of year, year, and stated date of Nov. 28, 2023  Attention:  Good  Concentration:  Good  Memory:  WNL  Fund of knowledge:   Good  Insight:    Good and Fair  Judgment:   Good  Impulse Control:  Good   Reported Symptoms:  anxiety, depression  Risk Assessment: Danger to Self:  No Self-injurious Behavior: No Danger to Others: No Duty to Warn:no Physical Aggression / Violence:No  Access to Firearms a concern: No  Gang Involvement:No  Patient / guardian was educated about steps to take if suicide or homicide risk level increases between visits: Denies any SI or HI.  While future psychiatric events cannot be accurately predicted, the patient does not currently require acute inpatient psychiatric care and does not currently meet [facility] involuntary commitment criteria.  Substance Abuse History: Current substance abuse: No     Past Psychiatric History:   Previous psychological history is significant for anxiety Outpatient Providers:don't recall name History of Psych Hospitalization: No  Psychological Testing:  n/a    Abuse History: Victim of No.,  n/a    Report needed: No. Victim of Neglect:No. Perpetrator of  n/a    Witness / Exposure to Domestic Violence: No   Protective Services Involvement: No  Witness to [employer] Violence:  No   Family History: (patient shared some info)  Married for 33 yrs. Husband worked in Science writer, patient worked as Automotive engineer; 1 biological sister, not close to half-sisters (2) nor half-brother. Dad owned a Audiological scientist in [place] where patient grew up, mom did not work outside the home. Attended [employer] and met husband there. Had 1 daughter in [place] and 1 son in [place]. Has 5 grandchildren   Living situation: the patient lives with their spouse, been married 32 yrs.   Sexual Orientation:  Straight  Relationship Status: married  Name of spouse / other:n/a             If a parent, number of children / ages:2 adult kids, ages in their 73's, 63 female and 52 female, 1 living in [place] and 1 living in [place]. Has 1 sister living in [place].   Support Systems; spouse A few friends and my sister.   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 History of legal issue / charges:  n/a  Medical History/Surgical History:not wanting to process any medical history but also states she has no medical issues that would impact her tx and no serious med issues at all per patient report.  No past medical history on file.  No past surgical history on  file.  Medications: still taking her effexor Current Outpatient Medications  Medication Sig Dispense Refill   venlafaxine XR (EFFEXOR XR) 37.5 MG 24 hr capsule 37.5 mg. Takes 3 tabs daily     No current facility-administered medications for this visit.    Not on File  Diagnoses:    ICD-10-CM   1. Generalized anxiety disorder  F41.1      Treatment goal plan: Patient not signing tx plan on computer screen due to concerns about Covid. Treatment goals: Treatment goals remain on treatment plan as patient works with strategies to achieve her goals. Progress is assessed each session and documented in the "plan/progress" sections of treatment note. Long-term goal: Reduce overall level, frequency, and intensity of the anxiety so that daily functioning is not impaired.  Short term goal: Verbalize an understanding of how thoughts, physical feelings, and behavioral actions contribute to anxiety and it's treatment. Strategies: Develop behavioral and cognitive strategies to reduce or eliminate the irrational anxiety. Identify, challenge, and replace fearful self-talk with positive, realistic, and empowering self-talk.    Plan of Care:  This is first session for this patient with this therapist.  [name] is a 71 year old, married, female patient who presents with a lot of anxiety, ruminating, and some depression but no thoughts to harm self nor others.  She was so anxious that it was hard for her to really focus on some of the questions today so I was not able to get as much information as usual in an initial evaluation, but she did provide some good info and we will review some of this at her next session and be able to add what ever information needed.  Patient adds that "anxiety is my strongest symptom and is usually related to my adult daughter".  During my time with her today she focused heavily on this "negative" relationship with adult daughter and it did sound very one-sided.  We  will explore this more in sessions.  Patient reports that she has had some therapy before but she cannot remember the name of the person as it was a long time ago.  No history of psych hospitalization.  Denies any abuse history.  Martha has been married for 33 years and her husband brought her to the appointment today and waited in the lobby during the appointment.  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  Patient reports having 1 biological sister with whom she has contact and also to 9 biological half-sisters and a half brother with whom she has "practically no contact".  States that her father owned a Audiological scientist in [place] where patient grew up, and mom did not work outside the home.  Patient and her husband have enjoyed having 2 adult children, a daughter in [place] and a son in [place], along with 5 grandchildren.  The daughter in [place] seems to be a particularly touchy issue for patient.  We will learn more about this I feel she were in upcoming sessions.  Her support system includes "  a few friends and my sister".  She reports there are no financial stressors in the home.  Attends church weekly.  Reports hobbies include sewing and smoking.  Patient states that her primary stressor is the daughter that lives in [place] who is "bossy, hateful, and controlling" adding that she feels this relationship is what disrupts her life so much and creates her depressive symptoms.  Not sure of the reliability of all of the information that was shared with this therapist today.  Worked collaboratively on treatment goal plan mostly aimed at anxiety and/or depressive symptoms and noted above in this note.  Will be seeing patient again next week for a regular session after having completed this intake and initial evaluation today.  For further information on this patient including personal history, risk assessment, and medical  history, please see the above sections of this full initial evaluation.  Review of initial treatment goal plan and patient is in agreement.  Next appointment being scheduled within 1 week.  This record has been created using [employer].  Chart creation errors have been sought, but may not always have been located and corrected.  Such creation errors do not reflect on the standard of medical care provided.   Shanon Ace, LCSW

## 2022-05-18 ENCOUNTER — Ambulatory Visit: Payer: Medicare PPO | Admitting: Psychiatry

## 2022-05-18 DIAGNOSIS — F411 Generalized anxiety disorder: Secondary | ICD-10-CM

## 2022-05-18 NOTE — Progress Notes (Signed)
[facility] Counselor/Therapist Progress Note  Patient ID: Monique Patterson, MRN: [id number],    Date: 05/18/2022  Time Spent: 55 minutes   Treatment Type: Individual Therapy  Reported Symptoms: anxiety, holding onto to "negatives" from past, obsessiveness  Mental Status Exam:  Appearance:   Neat and Well Groomed     Behavior:  Sharing and Motivated  Motor:  Normal  Speech/Language:   Clear and Coherent  Affect:  Anxious, worrying  Mood:  anxious  Thought process:  Focusing more on past; repetitive  Thought content:    Some obsessive thoughts  Sensory/Perceptual disturbances:    WNL  Orientation:  oriented to person, place, time/date, situation, day of week, month of year, year, and stated date of Dec. 7, 2023  Attention:  Fair  Concentration:  Fair  Memory:  Some short term  memory issues  Fund of knowledge:   Good and Fair  Insight:    Good and Fair  Judgment:   Good and Fair  Impulse Control:  Good   Risk Assessment: Danger to Self:  No Self-injurious Behavior: No Danger to Others: No Duty to Warn:no Physical Aggression / Violence:No  Access to Firearms a concern: No  Gang Involvement:No   Subjective: Patient today states her "main concern" is her anxiety and is related to adult daughter with 3 boys(ages approx 8,12, 14)  in [place]. Hard for patient to stay focused but is clearly feeling that relationship with daughter "is a lot to do with my problems".  Patient's "mild cognitive impairment" is also a factor. Has been evaluated re: memory and diagnosed with mild cognitive impairment, and patient reports no meds prescribed. Has had some restless dreams recently but "not frequently" and doesn't typically remember them. States she has a few friends. Reporting husband goes to daughter's home in [place] at least x2 monthly. Talked about her obsessiveness with adult daughter and lots of negativity. Difficult for patient to make changes per her report. Describes daughter's  behavior as controlling, negative, and blaming and patient states daughter. Difficulty focusing on any positives and we will continue work on this, including ways she can feel/understand that she does have some control in "how she feels". Also wanting to strengthen her communication with husband and not expect him to know what her thoughts are with her speaking them. Does seem to feel daughter pulls husband away from patient.  Interventions: Cognitive Behavioral Therapy, Motivational Interviewing, Solution-Oriented/Positive Psychology, and Ego-Supportive  Long-term goal: Reduce overall level, frequency, and intensity of the anxiety so that daily functioning is not impaired.  Short term goal: Verbalize an understanding of how thoughts, physical feelings, and behavioral actions contribute to anxiety and it's treatment. Strategies: Develop behavioral and cognitive strategies to reduce or eliminate the irrational anxiety. Identify, challenge, and replace fearful self-talk with positive, realistic, and empowering self-talk.   Diagnosis:   ICD-10-CM   1. Generalized anxiety disorder  F41.1      Plan: Focus with patient today on her obsessiveness and negativity in relationship with daughter who lives with family in [place].  For a long time, patient has been overly focused with that relationship and feeling that daughter is the reason for most of patient's problems.  Patient does have some diagnosed mild cognitive impairment without any medication prescribed.  She does appear to have some abilities to make certain changes and understand how they might help her.  Is to begin with some simple exercises in between sessions that involve her being able to let go  of daughter some and not automatically revert back to her whether it is in conversations with her husband or just thoughts within herself.  Looked at ways of being able to interrupt her thoughts at times and patient is to practice this between sessions.   Also encouraged her to be journaling between sessions and bring her journaling in with her next visit. Encouraged patient in practicing more positive behaviors that can support her goals including: Staying in the present focusing on what she can control or change, working hard to let go of pain and struggles from the past that relate to her daughter, getting outside some each day, staying in touch with supportive people, trying to look for more positives versus negatives daily, remain on prescribed medication, healthy nutrition and exercise, being open to other ways of looking at things that have a long history of bothering her, stop assuming negatives, stop assuming worst case scenarios, practice consistent positive self talk, practice more intentional listening to others, reduce overthinking and over analyzing, interrupt negative/anxious thoughts and challenge them to replace with more reality based thoughts, letting go of things from the past that can hold her back now, and recognize the strength she shows when working with goal-directed behaviors to move in a direction that supports her increased confidence and improved emotional health.  Goal review and progress/challenges noted with patient.  Next appointment within 2 to 3 weeks.  This record has been created using [employer].  Chart creation errors have been sought, but may not always have been located and corrected.  Such creation errors do not reflect on the standard of medical care provided.   Mathis Fare, LCSW

## 2022-06-01 ENCOUNTER — Ambulatory Visit: Payer: Medicare PPO | Admitting: Psychiatry

## 2022-06-01 DIAGNOSIS — F411 Generalized anxiety disorder: Secondary | ICD-10-CM

## 2022-06-01 NOTE — Progress Notes (Signed)
[facility] Counselor/Therapist Progress Note  Patient ID: Monique Patterson, MRN: [id number],    Date: 06/01/2022  Time Spent: 55 minutes   Treatment Type: Individual Therapy  Reported Symptoms: anxiety, anger, depression (some improvement), frustrated  Mental Status Exam:  Appearance:   Neat and Well Groomed     Behavior:  Sharing and consistent talking, doubting others including family  Motor:  Normal  Speech/Language:   Understandable speech, talk is often doubting others and not sure assumptions are accurate.   Affect:  anxious  Mood:  anxious  Thought process:  Some tangentiality  Thought content:    Rumination and some obessiveness  Sensory/Perceptual disturbances:    WNL  Orientation:  oriented to person, place, time/date, situation, day of week, month of year, year, and stated date of Dec. 21, 2023  Attention:  Fair  Concentration:  Fair  Memory:  Decribes some memory or processing issues at times and reportedly saw a Dr about 6 mos ago "but not sure what type"  Fund of knowledge:   Good and Fair  Insight:    Good and Fair  Judgment:   Good  Impulse Control:  Good   Risk Assessment: Danger to Self:  No Self-injurious Behavior: No Danger to Others: No Duty to Warn:no Physical Aggression / Violence:No  Access to Firearms a concern: No  Gang Involvement:No   Subjective: Patient in today reporting anxiety as her main symptom. States she is not depressed since starting therapy as "this gives me the chance to talk to someone freely."  Patient today in her talking continues to frequently go on tangents and finding it hard to to stay on subject. Often feels daughter is controlling in relationship to patient and husband. At least a hint of suspiciousness at times in regards to her talking today. "Bothers me that husband and I don't have much time gather any seems to spend time with other people."  Patient very sensitive and easy to interpret husband's involvement and  other activities to be that he does not want to spend time with her which he has told her is not true.  When she was explaining about his time away from home she gave examples of his work part-time at a [employer] delivering flowers 2 days a week and it bothered her that they had a holiday function for employees only and she was not invited to go as a spouse, feeling that "they could have added 1 more person on".  She shared a couple other examples that were similar and it does sound like there is some miscommunication within the marriage, some sensitivity on the part of patient, and also may be some initial memory issues.  Patient does seem to struggle some with obsessiveness but also going off in different directions in conversation at times.  Also learned that husband had ask about taking her out for her birthday tomorrow and she had declined.  We spoke about this in session and patient was encouraged to except husband's imitation.  This is only her second appointment with this therapist.  Encouraged patient to do more journaling in a notebook that she takes around with her.  To further clarify her goals next session as patient had some difficulty staying focused on this today.  Is wanting to strengthen her communication with husband and will plan to incorporate this within her goals as it would also benefit her outlook to do so.  Interventions: Cognitive Behavioral Therapy, Solution-Oriented/Positive Psychology, and Ego-Supportive  Long-term  goal: Reduce overall level, frequency, and intensity of the anxiety so that daily functioning is not impaired.  Short term goal: Verbalize an understanding of how thoughts, physical feelings, and behavioral actions contribute to anxiety and it's treatment. Strategies: Develop behavioral and cognitive strategies to reduce or eliminate the irrational anxiety. Identify, challenge, and replace fearful self-talk with positive, realistic, and empowering self-talk.    Diagnosis:   ICD-10-CM   1. Generalized anxiety disorder  F41.1      Plan: Patient in today reporting her main symptom to be anxiety.  Reports that she is not depressed now that she has started therapy.  Feels daughter overly controls his their relationship and also of the relationship between patient and husband.  Daughter lives in [place].  Patient is seeming suspicious at times but not really consistently.  Difficulty staying in a conversation without taking it over.  Very easy to miss read something someone says and take it in another direction.  Some at least slight suspiciousness which I want to explore with her more.  Med provider is to see her regarding her reported anxiety to determine if there is something that might help her in that area especially as she does seem on edge frequently.  Continues to be consumed at times with the relationship and feelings about her daughter and feeling that husband is controlled by daughter and that daughter leaves patient out often times.  Patient has even made the comment that the daughter is the reason for most of her problems.  Working with patient on her being able to let go some of daughter and focus more on her own life and what she would like to change if possible and what she feels would help her live more in the present with some satisfaction.  Not sure at this point of patient's ability to make certain changes however I read the report from a doctor that was checking her for possible memory issues and she was classified as "mild" in terms of any memory concerns.  Patient encouraged and agreed to continue her journaling between sessions and bring it in again with her next visit. Encouraged patient in her practice of more positive behaviors that support her goals including: Staying in the present and focus on what she can control or change, working hard to let go of pain and struggles from the past that relate to her daughter, getting outside some each  day, staying in touch with supportive people, trying to look more for positives versus negatives each day, stay on prescribed medication, healthy nutrition and exercise, being open to other ways of looking at things that have a long history of bothering her, stop assuming negatives, refrain from assuming worst-case scenarios, practice consistent positive self talk, being more intentional and listening to others, reduce overthinking and over analyzing, interrupt negative/anxious thoughts and challenge them to replace with more reality based thoughts, letting go of things from the past that can hold her back now, and realize the strength she shows working with goal-directed behaviors to move in a direction that supports her increased confidence and improved emotional health and outlook.  Goal reviewing progress/challenges noted with patient.  Next appointment within 2 to 3 weeks.  This record has been created using [employer].  Chart creation errors have been sought, but may not always have been located and corrected.  Such creation errors do not reflect on the standard of medical care provided.   Mathis Fare, LCSW

## 2022-06-22 ENCOUNTER — Ambulatory Visit: Payer: Medicare PPO | Admitting: Psychiatry

## 2022-06-27 DIAGNOSIS — E782 Mixed hyperlipidemia: Secondary | ICD-10-CM | POA: Diagnosis not present

## 2022-06-27 DIAGNOSIS — F419 Anxiety disorder, unspecified: Secondary | ICD-10-CM | POA: Diagnosis not present

## 2022-06-27 DIAGNOSIS — Z6825 Body mass index (BMI) 25.0-25.9, adult: Secondary | ICD-10-CM | POA: Diagnosis not present

## 2022-06-27 DIAGNOSIS — F0390 Unspecified dementia without behavioral disturbance: Secondary | ICD-10-CM | POA: Diagnosis not present

## 2022-06-28 ENCOUNTER — Ambulatory Visit: Payer: Medicare PPO | Admitting: Adult Health

## 2022-08-18 DIAGNOSIS — Z0000 Encounter for general adult medical examination without abnormal findings: Secondary | ICD-10-CM | POA: Diagnosis not present

## 2022-08-18 DIAGNOSIS — F0390 Unspecified dementia without behavioral disturbance: Secondary | ICD-10-CM | POA: Diagnosis not present

## 2022-08-18 DIAGNOSIS — Z1389 Encounter for screening for other disorder: Secondary | ICD-10-CM | POA: Diagnosis not present

## 2022-08-18 DIAGNOSIS — F3342 Major depressive disorder, recurrent, in full remission: Secondary | ICD-10-CM | POA: Diagnosis not present

## 2022-08-18 DIAGNOSIS — E782 Mixed hyperlipidemia: Secondary | ICD-10-CM | POA: Diagnosis not present

## 2022-08-18 DIAGNOSIS — N1831 Chronic kidney disease, stage 3a: Secondary | ICD-10-CM | POA: Diagnosis not present

## 2022-08-18 DIAGNOSIS — Z6825 Body mass index (BMI) 25.0-25.9, adult: Secondary | ICD-10-CM | POA: Diagnosis not present

## 2022-08-18 DIAGNOSIS — Z23 Encounter for immunization: Secondary | ICD-10-CM | POA: Diagnosis not present

## 2022-08-18 DIAGNOSIS — E663 Overweight: Secondary | ICD-10-CM | POA: Diagnosis not present

## 2022-10-25 DIAGNOSIS — H2513 Age-related nuclear cataract, bilateral: Secondary | ICD-10-CM | POA: Diagnosis not present

## 2022-11-21 DIAGNOSIS — L814 Other melanin hyperpigmentation: Secondary | ICD-10-CM | POA: Diagnosis not present

## 2022-11-21 DIAGNOSIS — D225 Melanocytic nevi of trunk: Secondary | ICD-10-CM | POA: Diagnosis not present

## 2022-11-21 DIAGNOSIS — L821 Other seborrheic keratosis: Secondary | ICD-10-CM | POA: Diagnosis not present

## 2023-01-26 ENCOUNTER — Encounter: Payer: Medicare PPO | Admitting: Psychology

## 2023-02-02 ENCOUNTER — Encounter: Payer: Medicare PPO | Admitting: Psychology

## 2023-02-27 IMAGING — CT CT HEAD W/O CM
1 series · 16 of 30 positions shown, 20 images · non-contrast
Comparison: None.

CLINICAL DATA: Fall and right-sided headache.

EXAM:
CT HEAD WITHOUT CONTRAST
TECHNIQUE: Contiguous axial images were obtained from the base of the skull
through the vertex without intravenous contrast.

[Series 2: head w/(date) · axial · 0.49mm/px · z∈[-204,-44]mm · 16 of 36 slices shown, 20 images]
[im 2/36  brain]
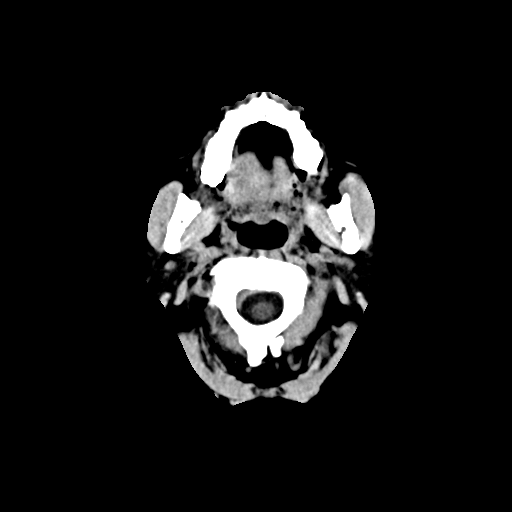
[im 2/36  bone]
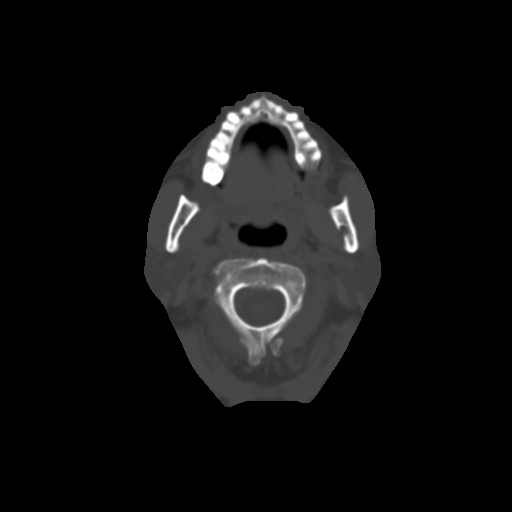
[im 4/36  brain]
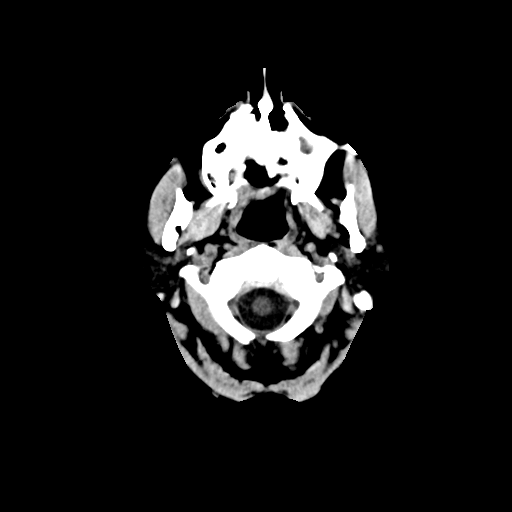
[im 7/36  brain]
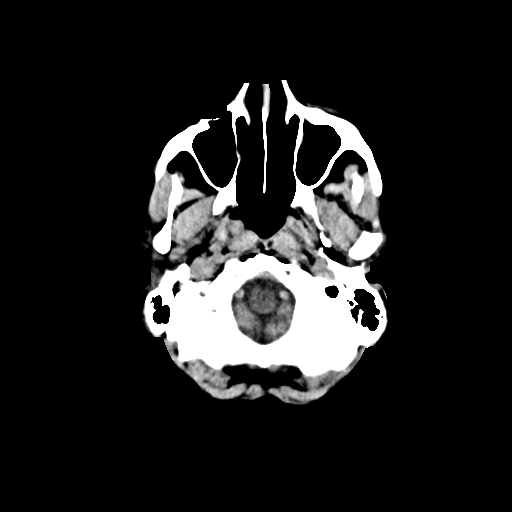
[im 9/36  brain]
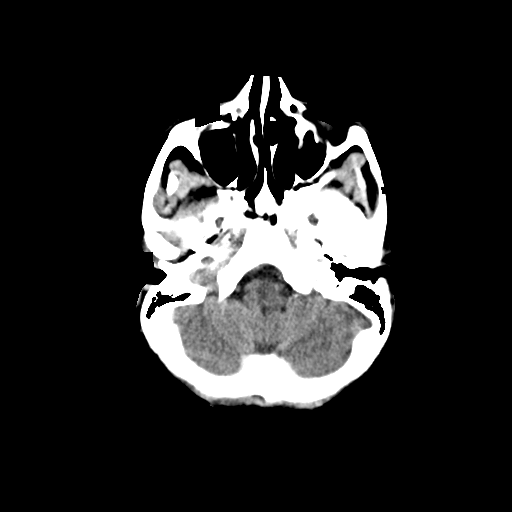
[im 10/36  brain]
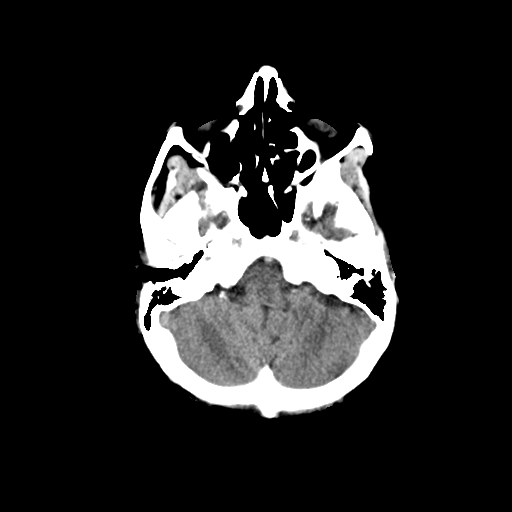
[im 10/36  bone]
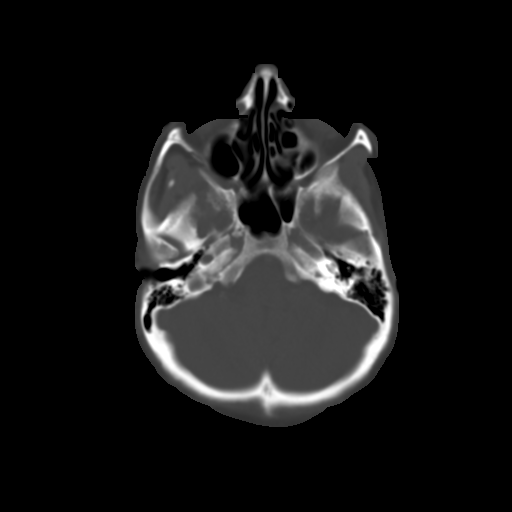
[im 13/36  brain]
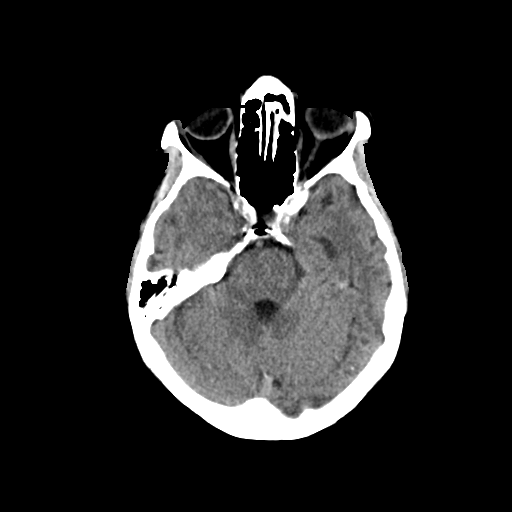
[im 15/36  brain]
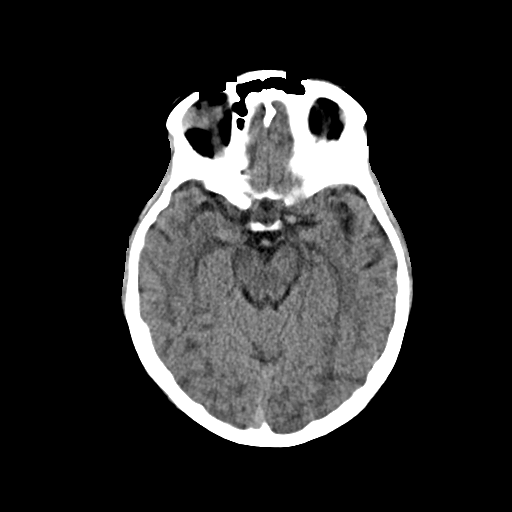
[im 17/36  brain]
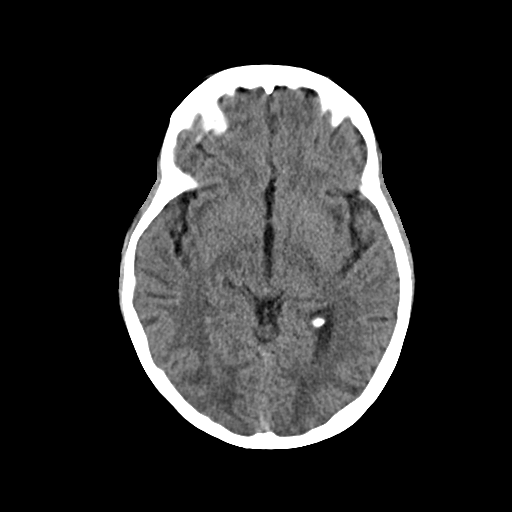
[im 19/36  brain]
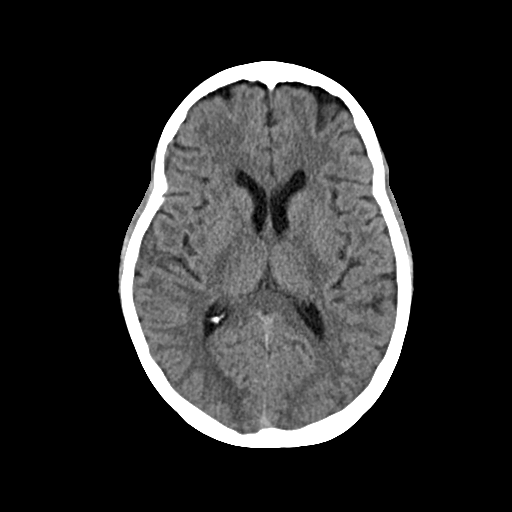
[im 19/36  bone]
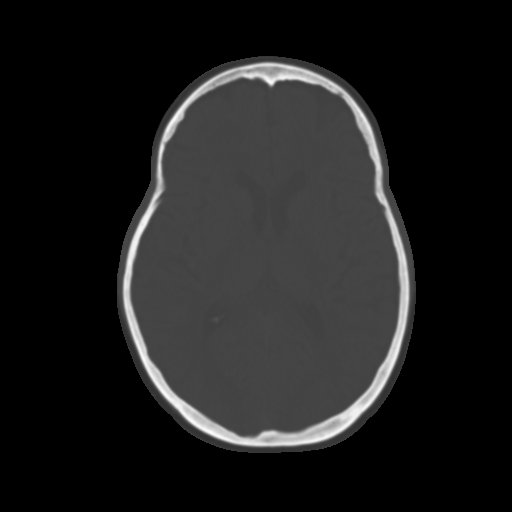
[im 21/36  brain]
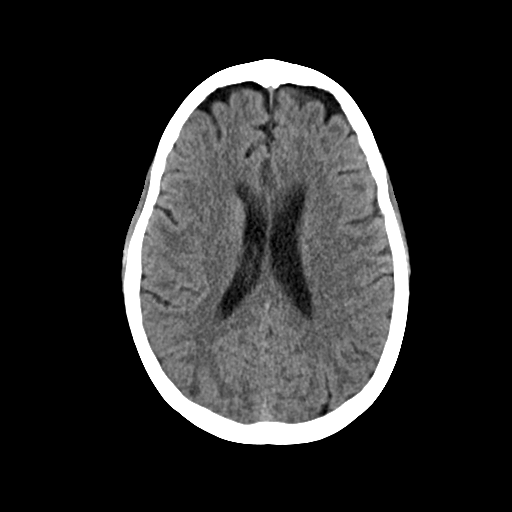
[im 23/36  brain]
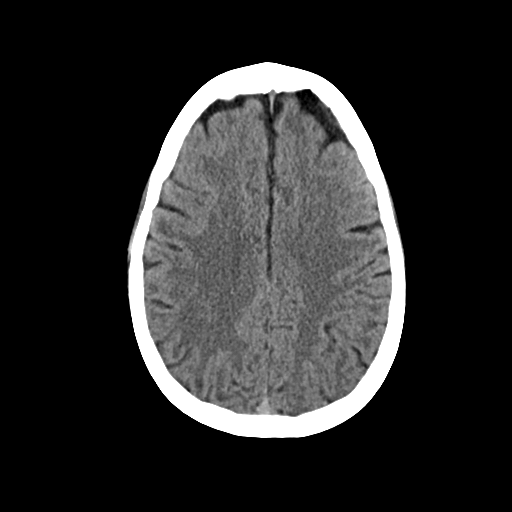
[im 26/36  brain]
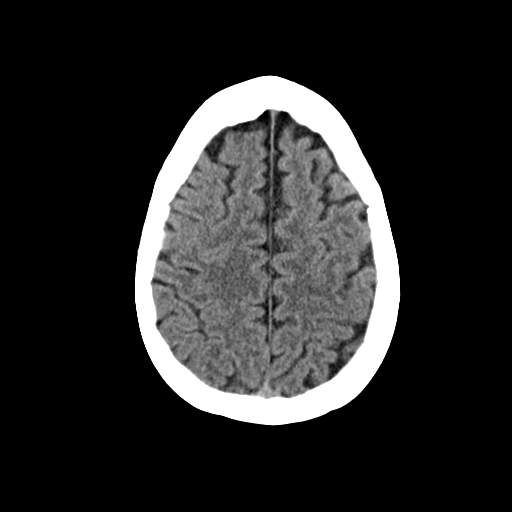
[im 27/36  brain]
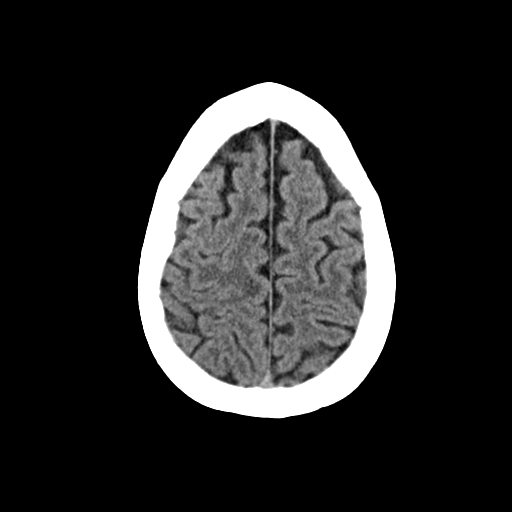
[im 27/36  bone]
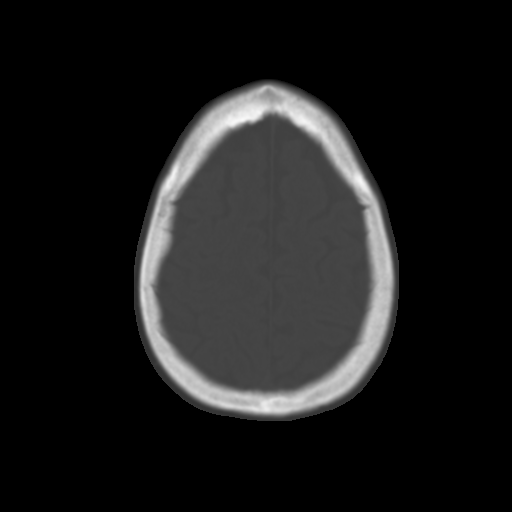
[im 29/36  brain]
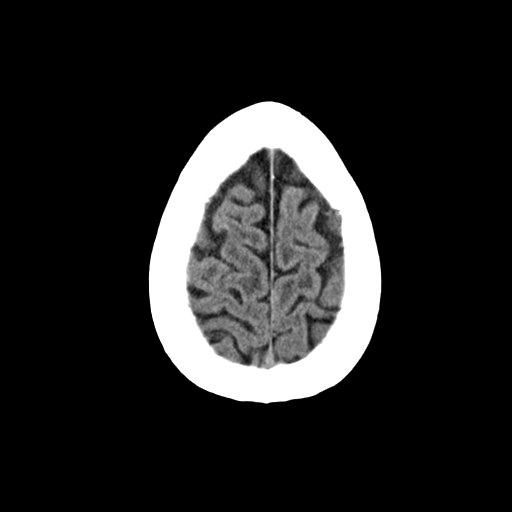
[im 32/36  brain]
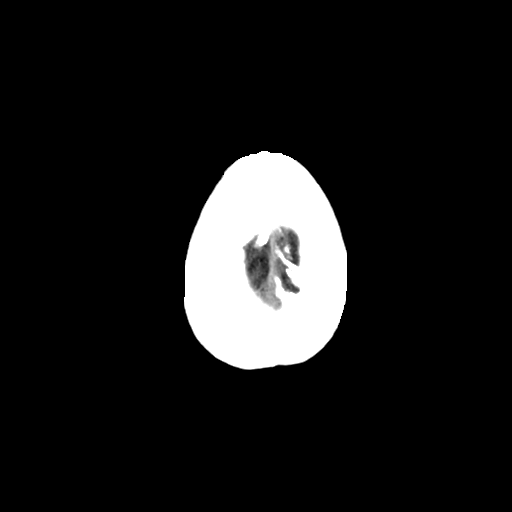
[im 34/36  brain]
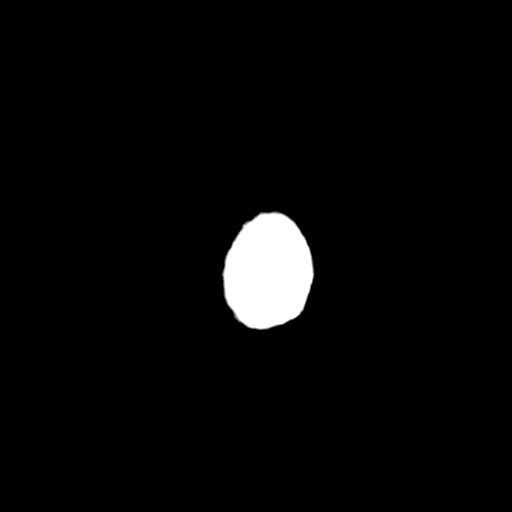

[16 of 30 positions shown; findings below may reference images not displayed]

FINDINGS: Brain: The ventricles and sulci appropriate size for patient's age.
The gray-white matter discrimination is preserved. There is no acute
intracranial hemorrhage. No mass effect or midline shift. No
extra-axial fluid collection.

Vascular: No hyperdense vessel or unexpected calcification.

Skull: Normal. Negative for fracture or focal lesion.

Sinuses/Orbits: There is mild mucoperiosteal thickening of paranasal
sinuses. No air-fluid level. Mastoid air cells are clear.

Other: None
IMPRESSION: No acute intracranial pathology.

## 2023-02-28 DIAGNOSIS — E782 Mixed hyperlipidemia: Secondary | ICD-10-CM | POA: Diagnosis not present

## 2023-02-28 DIAGNOSIS — H9190 Unspecified hearing loss, unspecified ear: Secondary | ICD-10-CM | POA: Diagnosis not present

## 2023-02-28 DIAGNOSIS — Z6823 Body mass index (BMI) 23.0-23.9, adult: Secondary | ICD-10-CM | POA: Diagnosis not present

## 2023-02-28 DIAGNOSIS — F419 Anxiety disorder, unspecified: Secondary | ICD-10-CM | POA: Diagnosis not present

## 2023-02-28 DIAGNOSIS — F0390 Unspecified dementia without behavioral disturbance: Secondary | ICD-10-CM | POA: Diagnosis not present

## 2023-04-03 DIAGNOSIS — R141 Gas pain: Secondary | ICD-10-CM | POA: Insufficient documentation

## 2023-04-06 ENCOUNTER — Encounter: Payer: Self-pay | Admitting: Psychology

## 2023-04-09 ENCOUNTER — Ambulatory Visit: Payer: Medicare PPO | Admitting: Psychology

## 2023-04-09 ENCOUNTER — Encounter: Payer: Self-pay | Admitting: Psychology

## 2023-04-09 ENCOUNTER — Ambulatory Visit: Payer: Medicare PPO

## 2023-04-09 DIAGNOSIS — G309 Alzheimer's disease, unspecified: Secondary | ICD-10-CM | POA: Diagnosis not present

## 2023-04-09 DIAGNOSIS — F03A0 Unspecified dementia, mild, without behavioral disturbance, psychotic disturbance, mood disturbance, and anxiety: Secondary | ICD-10-CM | POA: Insufficient documentation

## 2023-04-09 DIAGNOSIS — F02A4 Dementia in other diseases classified elsewhere, mild, with anxiety: Secondary | ICD-10-CM | POA: Diagnosis not present

## 2023-04-09 DIAGNOSIS — R4189 Other symptoms and signs involving cognitive functions and awareness: Secondary | ICD-10-CM

## 2023-04-09 NOTE — Progress Notes (Signed)
   Psychometrician Note   Cognitive testing was administered to [employer] by Shan Levans, B.S. (psychometrist) under the supervision of Dr. Newman Nickels, Ph.D., licensed psychologist on 04/09/2023. Ms. Burnum did not appear overtly distressed by the testing session per behavioral observation or responses across self-report questionnaires. Rest breaks were offered.    The battery of tests administered was selected by Dr. Newman Nickels, Ph.D. with consideration to Ms. Oliphant's current level of functioning, the nature of her symptoms, emotional and behavioral responses during interview, level of literacy, observed level of motivation/effort, and the nature of the referral question. This battery was communicated to the psychometrist. Communication between Dr. Newman Nickels, Ph.D. and the psychometrist was ongoing throughout the evaluation and Dr. Newman Nickels, Ph.D. was immediately accessible at all times. Dr. Newman Nickels, Ph.D. provided supervision to the psychometrist on the date of this service to the extent necessary to assure the quality of all services provided.    Matlyn Scruton will return within approximately 1-2 weeks for an interactive feedback session with Dr. Milbert Coulter at which time her test performances, clinical impressions, and treatment recommendations will be reviewed in detail. Ms. Danner understands she can contact our office should she require our assistance before this time.  A total of 85 minutes of billable time were spent face-to-face with Ms. Renaldo Harrison by the psychometrist. This includes both test administration and scoring time. Billing for these services is reflected in the clinical report generated by Dr. Newman Nickels, Ph.D.  This note reflects time spent with the psychometrician and does not include test scores or any clinical interpretations made by Dr. Milbert Coulter. The full report will follow in a separate note.

## 2023-04-09 NOTE — Progress Notes (Signed)
NEUROPSYCHOLOGICAL EVALUATION [facility] Department of Neurology  Date of Evaluation: April 09, 2023  Reason for Referral:   Monique Patterson is a 72 y.o. left-handed Caucasian female referred by Marlowe Kays, PA-C, to characterize her current cognitive functioning and assist with diagnostic clarity and treatment planning in the context of family-held concern surrounding cognitive decline.   Assessment and Plan:   Clinical Impression(s): Monique Patterson pattern of performance is suggestive of severe impairment surrounding all aspects of learning and memory. Additional severe impairments were exhibited across semantic fluency and cognitive flexibility, while performance variability was exhibited across confrontation naming and visuospatial abilities. Performances were appropriate relative to age-matched peers across processing speed, attention/concentration, receptive language, and phonemic fluency. Functionally, Monique Patterson denied all concerns. However, I do question if she has appropriate insight into the extent of ongoing memory impairments. Her family have raised ongoing concerns surrounding ongoing functional decline. Given the severity of impairment across the aforementioned domains and the likelihood that this is directly interfering with day-to-day functioning, I feel that Monique Patterson best meets diagnostic criteria for a Major Neurocognitive Disorder ("dementia"). However, she may be towards the more mild end of this spectrum presently.   Regarding the cause of her mild dementia presentation, I have primary concerns surrounding underlying Alzheimer's disease. Across memory testing, Monique Patterson did not benefit from repeated exposure to novel information, was fully amnestic (i.e., 0% retention) after a brief delay across all three memory tasks, and performed very poorly across yes/no recognition trials. This suggests evidence for rapid forgetting and a  pronounced storage impairment, which is the hallmark testing pattern for this illness. Further deficits surrounding semantic fluency, confrontation naming, cognitive flexibility, and visuospatial abilities also follow very typical disease progression, escalating these concerns. Continued medical monitoring will be important moving forward.  Recommendations: [name] is scheduled to follow-up with her neurologist Huntley Dec [place], [place]) on 05/01/2023. At that appointment, I would encourage her to discuss medication aimed to address memory loss and concerns surrounding Alzheimer's disease. It is important to highlight that these medications have been shown to slow functional decline in some individuals. There is no current treatment which can stop or reverse cognitive decline when caused by a neurodegenerative illness.   Monique Patterson was referred for a brain MRI in November 2023. This does not appear to have ever been scheduled or completed. I would recommend that Monique Patterson also discuss an updated referral with Monique Patterson when meeting with her next month.   A repeat neuropsychological evaluation in 12-18 months could be considered to assess the trajectory of future cognitive decline should it occur. This will also aid in future efforts towards improved diagnostic clarity.  Monique Patterson is encouraged to speak with her PCP regarding medication adjustments to optimally manage ongoing concerns surrounding depression and generalized anxious distress.  Performance across neurocognitive testing is not a strong predictor of an individual's safety operating a motor vehicle. Should her family wish to pursue a formalized driving evaluation, they could reach out to the following agencies: [employer] in [place]: [phone] [facility]: [phone] [facility]: [phone] [facility]: [phone] or [phone]  Should there be progression of current  deficits over time, Monique Patterson is unlikely to regain any independent living skills lost. Therefore, it is recommended that she remain as involved as possible in all aspects of household chores, finances, and medication management, with supervision to ensure adequate performance. She will likely benefit from the establishment and maintenance of  a routine in order to maximize her functional abilities over time.  It will be important for Monique Patterson to have another person with her when in situations where she may need to process information, weigh the pros and cons of different options, and make decisions, in order to ensure that she fully understands and recalls all information to be considered.  If not already done, Monique Patterson and her family may want to discuss her wishes regarding durable power of attorney and medical decision making, so that she can have input into these choices. If they require legal assistance with this, long-term care resource access, or other aspects of estate planning, they could reach out to [employer] at [phone] for a free consultation. Additionally, they may wish to discuss future plans for caretaking and seek out community options for in home/residential care should they become necessary.  Monique Patterson is encouraged to attend to lifestyle factors for brain health (e.g., regular physical exercise, good nutrition habits and consideration of the MIND-DASH diet, regular participation in cognitively-stimulating activities, and general stress management techniques), which are likely to have benefits for both emotional adjustment and cognition. In fact, in addition to promoting good general health, regular exercise incorporating aerobic activities (e.g., brisk walking, jogging, cycling, etc.) has been demonstrated to be a very effective treatment for depression and stress, with similar efficacy rates to both antidepressant medication and psychotherapy. Optimal control of  vascular risk factors (including safe cardiovascular exercise and adherence to dietary recommendations) is encouraged. Continued participation in activities which provide mental stimulation and social interaction is also recommended.   Important information should be provided to Ms. Vanorder in written format in all instances. This information should be placed in a highly frequented and easily visible location within her home to promote recall. External strategies such as written notes in a consistently used memory journal, visual and nonverbal auditory cues such as a calendar on the refrigerator or appointments with alarm, such as on a cell phone, can also help maximize recall.  Review of Records:   Ms. Downen was seen by [facility] Marlowe Kays, PA-C) on 04/19/2022 for an evaluation of memory loss. At that time, her family noted concerns surrounding generalized forgetfulness and increased repetition in day-to-day conversation. There was also description of significant anxiety, as well as some depressive concerns. Trouble with ADLs was denied. Performance on a brief cognitive screening instrument (MOCA) was 20/30. Ultimately, Ms. Marullo was referred for a comprehensive neuropsychological evaluation to characterize her cognitive abilities and to assist with diagnostic clarity and treatment planning.   Neuroimaging Head CT on 02/01/2021 was negative. A brain MRI had been ordered by Monique Patterson on 04/19/2022. However, Ms. Miskiewicz does not appear to have scheduled or completed this scan. As such, no other neuroimaging was available for review.   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   Current Outpatient Medications:  venlafaxine XR (EFFEXOR XR) 37.5 MG 24 hr capsule, 37.5 mg. Takes 3 tabs daily, Disp: , Rfl:   Clinical Interview:   The following information was obtained during a clinical interview with Ms. Varisco, her husband, and her sister prior to cognitive testing.  Cognitive Symptoms: Decreased short-term memory: Denied. Separately, her husband and sister described more prominent memory concerns. They noted instances of rapid forgetting and increased repetition in day-to-day conversation. Her sister also added that Ms. Mcsweeney has made comments surrounding not knowing who her husband is and being unable to recall their wedding. There are also concerns that she also does not routinely recognize her children. Her sister also provided a recent example where Ms. Markell purchased five pairs of pants due to forgetfulness. Per her family, progressive cognitive decline has been observed for the prior 2-3 years.  Decreased long-term memory: Denied. Decreased attention/concentration: Denied. Reduced processing speed: Denied. Difficulties with executive functions: Denied. She also denied trouble with impulsivity or any personality changes. Her husband alluded to some changes surrounding increased irritability from time to time.  Difficulties with emotion regulation: Denied. Difficulties with receptive language: Denied. Difficulties with word finding: Denied. Decreased visuoperceptual ability: Denied.  Difficulties completing ADLs: Ms. Hennesy denied all concerns. Her husband noted that he may double check or provide reminders  surrounding medication management. Her sister was in agreement that Ms. Lillo's driving seems adequate while in [place]. However, she did state her belief that Ms. Cristo should not drive to see her in [place] due to fears that she would get lost in a less familiar environment.   Additional Medical History: History of traumatic brain injury/concussion: Denied. Her sister reminded her of a potential concussion about 7-8 years prior after Ms. Apollo slipped and fell. A very brief loss in consciousness was theorized. No persisting difficulties were reported. No more recent head injuries were described.  History of stroke: Denied. History of seizure activity: Denied. History of known exposure to toxins: Denied. Symptoms of chronic pain: Denied. Experience of frequent headaches/migraines: Denied. Frequent instances of dizziness/vertigo: Denied.  Sensory changes: She wears glasses with benefit. Other sensory changes/difficulties (e.g., hearing, taste, smell) were not reported.  Balance/coordination difficulties: Denied. She also denied any recent falls.  Other motor difficulties: Denied.  Sleep History: Estimated hours obtained each night: 8 hours.  Difficulties falling asleep: Denied. Difficulties staying asleep: Denied. Feels rested and refreshed upon awakening: Endorsed.  History of snoring: Denied. History of waking up gasping for air: Denied. Witnessed breath cessation while asleep: Denied.  History of vivid dreaming: Denied. Excessive movement while asleep: Denied. Instances of acting out her dreams: Denied.  Psychiatric/Behavioral Health History: Depression: She described her mood as normal and did not indicate any concerns. Her sister highlighted that Ms. Sancho will often cry, especially while on the phone with her and that Ms. Haltiwanger has been prescribed and is taking Effexor for mood concerns. When hearing this, Ms. Hubers did describe some psychiatric distress and alluded  to some marital discord as being a primary source. Current or remote suicidal ideation, intent, or plan was denied.  Anxiety: She denied all concerns. Her husband noted significant spikes in anxiety surrounding physician visits, especially those associated with memory concerns and cognitive testing. As stated above, she has been prescribed Effexor. He did not report more generalized anxious distress.  Mania: Denied. Trauma History: Denied. Visual/auditory hallucinations: Denied. Delusional thoughts: Denied.  Tobacco: Denied. Alcohol: She reported very infrequent alcohol consumption and denied a history of problematic alcohol abuse or dependence.  Recreational drugs: Denied.  Family History: Problem Relation Age of Onset   Alzheimer's disease Maternal Aunt        numerous aunts   Frontotemporal dementia Maternal Aunt    This information was confirmed by Ms. Notch.  Academic/Vocational History: Highest level of educational attainment: 16 years. She graduated from high school and earned a Oncologist in education. She described herself as a strong (mostly A) student in academic settings. No relative weaknesses were reported.  History of developmental delay: Denied. History of grade repetition: Denied. Enrollment in special education courses: Denied. History of LD/ADHD: Denied.  Employment: Retired. She previously worked as a Runner, broadcasting/film/video.   Evaluation Results:   Behavioral Observations: Ms. Boivin was accompanied by her husband and sister and was appropriately dressed and groomed. She appeared alert. Observed gait and station were within normal limits. Gross motor functioning appeared intact upon informal observation and no abnormal movements (e.g., tremors) were noted. Her affect was fairly anxious during the interview process, especially when the prospect of cognitive testing was discussed. Spontaneous speech was fluent and word finding difficulties were not observed during the  clinical interview. She was notably tangential during interview. At several time points, she provided quite lengthy responses which were wholly unrelated to the initial question asked. Thought processes were otherwise normal in content. Insight into her cognitive difficulties appeared poor and there is the likelihood that she does not fully appreciate the extent of ongoing memory impairment.   During testing, sustained attention was appropriate. Task engagement was adequate and she persisted when challenged. She remained very tangential during testing. This did impact testing in a few isolated cases, including a semantic fluency task. When asked to name animals, she provided some, but upon stating the word "cat," she then progressed to tell a lengthy story involving her sister and one or more cats. Anxiety was noteworthy throughout the evaluation and fatigue did increase as the evaluation progressed. The evaluation was abbreviated in response. Overall, Ms. Stewart was cooperative with the clinical interview and subsequent testing procedures.   Adequacy of Effort: The validity of neuropsychological testing is limited by the extent to which the individual being tested may be assumed to have exerted adequate effort during testing. Ms. Spizzirri expressed her intention to perform to the best of her abilities and exhibited adequate task engagement and persistence. Scores across stand-alone and embedded performance validity measures were within expectation. As such, the results of the current evaluation are believed to be a valid representation of Ms. Blondin's current cognitive functioning.  Test Results: Ms. Larcom was poorly oriented at the time of the current evaluation. She incorrectly stated her age ("59"). She was also unable to state the current month, date, day of the week, or name of the clinic. When asked why she was here, she responded "My husband wanted me to."   Intellectual abilities based upon  educational and vocational attainment were estimated to be in the average range. Premorbid abilities were estimated to be within the average range based upon a single-word reading test.   Processing speed was average to above average. Basic attention was above average. More complex attention (e.g., working memory) was unable to be assessed due to anxiety and fatigue. Cognitive flexibility was exceptionally low. Other aspects of executive functioning were unable to be assessed.  Receptive language abilities were unable to be assessed directly. However, Ms. Pinkerton did not exhibit prominent difficulties comprehending task instructions and answered all questions asked of her appropriately. Assessed expressive language was somewhat  variable. Phonemic fluency was below average, semantic fluency was exceptionally low to well below average, and confrontation naming was average across a screening task but well below average across a more comprehensive task.      Assessed visuospatial/visuoconstructional abilities were variable, ranging from the well below average to well above average normative ranges. Points were lost on her drawing of a clock due to her including the number 9 twice, as well as no size differentiation between clock hands.     Learning (i.e., encoding) of novel verbal information was exceptionally low to well below average. Spontaneous delayed recall (i.e., retrieval) of previously learned information was exceptionally low. Retention rates were 0% across a story learning task, 0% across a list learning task, and 0% across a figure drawing task. Performance across recognition tasks was also exceptionally low, suggesting negligible evidence for information consolidation.   Results of emotional screening instruments suggested that recent symptoms of generalized anxiety were in the mild range, while symptoms of depression were within normal limits. A screening instrument assessing recent sleep quality  suggested the presence of minimal sleep dysfunction.  Tables of Scores:   Note: This summary of test scores accompanies the interpretive report and should not be considered in isolation without reference to the appropriate sections in the text. Descriptors are based on appropriate normative data and may be adjusted based on clinical judgment. Terms such as "Within Normal Limits" and "Outside Normal Limits" are used when a more specific description of the test score cannot be determined.       Percentile - Normative Descriptor > 98 - Exceptionally High 91-97 - Well Above Average 75-90 - Above Average 25-74 - Average 9-24 - Below Average 2-8 - Well Below Average < 2 - Exceptionally Low       Validity:   DESCRIPTOR       DCT: --- --- Within Normal Limits  RBANS EI: --- --- Within Normal Limits       Orientation:      Raw Score Percentile   NAB Orientation, Form 1 21/29 --- ---       Cognitive Screening:      Raw Score Percentile   SLUMS: 8/30 --- ---       RBANS, Form A: Standard Score/ Scaled Score Percentile   Total Score 71 3 Well Below Average  Immediate Memory 61 <1 Exceptionally Low    List Learning 3 1 Exceptionally Low    Story Memory 4 2 Well Below Average  Visuospatial/Constructional 96 39 Average    Figure Copy 14 91 Well Above Average    Line Orientation 10/20 3-9 Well Below Average  Language 85 16 Below Average    Picture Naming 9/10 26-50 Average    Semantic Fluency 4 2 Well Below Average  Attention 100 50 Average    Digit Span 12 75 Above Average    Coding 8 25 Average  Delayed Memory 44 <1 Exceptionally Low    List Recall 0/10 <2 Exceptionally Low    List Recognition 13/20 <2 Exceptionally Low    Story Recall 1 <1 Exceptionally Low    Story Recognition 5/12 1-2 Exceptionally Low    Figure Recall 1 <1 Exceptionally Low    Figure Recognition 1/8 1 Exceptionally Low        Intellectual Functioning:      Standard Score Percentile   Test of Premorbid  Functioning: 91 27 Average       Attention/Executive Function:     Trail Making Test (TMT):  Raw Score (T Score) Percentile     Part A 23 secs.,  0 errors (61) 86 Above Average    Part B Discontinued --- Impaired        Language:     Verbal Fluency Test: Raw Score (T Score) Percentile     Phonemic Fluency (FAS) 29 (40) 16 Below Average    Animal Fluency 3 (<19) <1 Exceptionally Low        NAB Language Module, Form 1: T Score Percentile     Naming 26/31 (28) 2 Well Below Average       Visuospatial/Visuoconstruction:      Raw Score Percentile   Clock Drawing: 7/10 --- Within Normal Limits       Mood and Personality:      Raw Score Percentile   Beck Depression Inventory - II: 6 --- Within Normal Limits  PROMIS Anxiety Questionnaire: 17 --- Mild       Additional Questionnaires:      Raw Score Percentile   PROMIS Sleep Disturbance Questionnaire: 12 --- None to Slight   Informed Consent and Coding/Compliance:   The current evaluation represents a clinical evaluation for the purposes previously outlined by the referral source and is in no way reflective of a forensic evaluation.   Ms. Stang was provided with a verbal description of the nature and purpose of the present neuropsychological evaluation. Also reviewed were the foreseeable risks and/or discomforts and benefits of the procedure, limits of confidentiality, and mandatory reporting requirements of this provider. The patient was given the opportunity to ask questions and receive answers about the evaluation. Oral consent to participate was provided by the patient.   This evaluation was conducted by Newman Nickels, Ph.D., ABPP-CN, board certified clinical neuropsychologist. Ms. Data completed a clinical interview with Dr. Milbert Coulter, billed as one unit [phone], and 85 minutes of cognitive testing and scoring, billed as one unit [phone] and two additional units [id number]. Psychometrist Shan Levans, B.S. assisted Dr. Milbert Coulter with test  administration and scoring procedures. As a separate and discrete service, one unit [id number] and two units [phone] were billed for Dr. Tammy Sours time spent in interpretation and report writing.

## 2023-04-17 ENCOUNTER — Ambulatory Visit: Payer: Medicare PPO | Admitting: Physician Assistant

## 2023-04-26 ENCOUNTER — Ambulatory Visit: Payer: Medicare PPO | Admitting: Psychology

## 2023-04-26 DIAGNOSIS — F02A4 Dementia in other diseases classified elsewhere, mild, with anxiety: Secondary | ICD-10-CM

## 2023-04-26 DIAGNOSIS — G309 Alzheimer's disease, unspecified: Secondary | ICD-10-CM

## 2023-04-26 NOTE — Progress Notes (Signed)
   Neuropsychology Feedback Session Eligha Bridegroom. [facility] [place] Department of Neurology  Reason for Referral:   Monique Patterson is a 72 y.o. left-handed Caucasian female referred by Marlowe Kays, PA-C, to characterize her current cognitive functioning and assist with diagnostic clarity and treatment planning in the context of family-held concern surrounding cognitive decline.   Feedback:   Ms. Chouinard completed a comprehensive neuropsychological evaluation on 04/09/2023. Please refer to that encounter for the full report and recommendations. Briefly, results suggested severe impairment surrounding all aspects of learning and memory. Additional severe impairments were exhibited across semantic fluency and cognitive flexibility, while performance variability was exhibited across confrontation naming and visuospatial abilities. Regarding the cause of her mild dementia presentation, I have primary concerns surrounding underlying Alzheimer's disease. Across memory testing, Ms. Miyasato did not benefit from repeated exposure to novel information, was fully amnestic (i.e., 0% retention) after a brief delay across all three memory tasks, and performed very poorly across yes/no recognition trials. This suggests evidence for rapid forgetting and a pronounced storage impairment, which is the hallmark testing pattern for this illness. Further deficits surrounding semantic fluency, confrontation naming, cognitive flexibility, and visuospatial abilities also follow very typical disease progression, escalating these concerns. Continued medical monitoring will be important moving forward.   Ms. Beus was accompanied by her husband during the current feedback session. Content of the current session focused on the results of her neuropsychological evaluation. Ms. Everard was given the opportunity to ask questions and her questions were answered. She was encouraged to reach out should additional questions  arise. A copy of her report was provided at the conclusion of the visit.      One unit [phone] was billed for Dr. Tammy Sours time spent preparing for, conducting, and documenting the current feedback session with Ms. Duke.

## 2023-05-01 ENCOUNTER — Ambulatory Visit: Payer: Medicare PPO | Admitting: Physician Assistant

## 2023-05-01 ENCOUNTER — Encounter: Payer: Self-pay | Admitting: Physician Assistant

## 2023-05-01 VITALS — BP 110/80 | HR 74 | Resp 20 | Ht 63.0 in

## 2023-05-01 DIAGNOSIS — F02A4 Dementia in other diseases classified elsewhere, mild, with anxiety: Secondary | ICD-10-CM | POA: Diagnosis not present

## 2023-05-01 DIAGNOSIS — G309 Alzheimer's disease, unspecified: Secondary | ICD-10-CM | POA: Diagnosis not present

## 2023-05-01 MED ORDER — MEMANTINE HCL 5 MG PO TABS: 5.0000 mg | ORAL_TABLET | Freq: Every evening | ORAL | 11 refills | Status: DC

## 2023-05-01 NOTE — Patient Instructions (Addendum)
It was a pleasure to see you today at our office.   Recommendations:   Follow up March 20  at 11;30  Continue Psychotherapy   Start Memantine 5mg  tablets.  Take 1 tablet at bedtime for 2 weeks, then 1 tablet twice daily.   Whom to call:  Memory  decline, memory medications: Call our office [phone]   For psychiatric meds, mood meds: Please have your primary care physician manage these medications.   Counseling regarding caregiver distress, including caregiver depression, anxiety and issues regarding community resources, adult day care programs, adult living facilities, or memory care questions:   Feel free to contact Misty Lisabeth Register, Social Worker at [phone]   For assessment of decision of mental capacity and competency:  Call Dr. Erick Blinks, geriatric psychiatrist at [phone]  For guidance in geriatric dementia issues please call [facility] [phone]  For guidance regarding [facility] and if placement were needed at the facility, contact Sidney Ace, Social Worker tel: [phone]  If you have any severe symptoms of a stroke, or other severe issues such as confusion,severe chills or fever, etc call 911 or go to the ER as you may need to be evaluated further   Feel free to visit Facebook page " [email or web address]" for tips of how to care for people with memory problems.      RECOMMENDATIONS FOR ALL PATIENTS WITH MEMORY PROBLEMS: 1. Continue to exercise (Recommend 30 minutes of walking everyday, or 3 hours every week) 2. Increase social interactions - continue going to [place] and enjoy social gatherings with friends and family 3. Eat healthy, avoid fried foods and eat more fruits and vegetables 4. Maintain adequate blood pressure, blood sugar, and blood cholesterol level. Reducing the risk of stroke and cardiovascular disease also helps promoting better memory. 5. Avoid stressful situations. Live a simple life and avoid aggravations.  Organize your time and prepare for the next day in anticipation. 6. Sleep well, avoid any interruptions of sleep and avoid any distractions in the bedroom that may interfere with adequate sleep quality 7. Avoid sugar, avoid sweets as there is a strong link between excessive sugar intake, diabetes, and cognitive impairment We discussed the Mediterranean diet, which has been shown to help patients reduce the risk of progressive memory disorders and reduces cardiovascular risk. This includes eating fish, eat fruits and green leafy vegetables, nuts like almonds and hazelnuts, walnuts, and also use olive oil. Avoid fast foods and fried foods as much as possible. Avoid sweets and sugar as sugar use has been linked to worsening of memory function.  There is always a concern of gradual progression of memory problems. If this is the case, then we may need to adjust level of care according to patient needs. Support, both to the patient and caregiver, should then be put into place.      You have been referred for a neuropsychological evaluation (i.e., evaluation of memory and thinking abilities). Please bring someone with you to this appointment if possible, as it is helpful for the doctor to hear from both you and another adult who knows you well. Please bring eyeglasses and hearing aids if you wear them.    The evaluation will take approximately 3 hours and has two parts:   The first part is a clinical interview with the neuropsychologist (Dr. Milbert Coulter or Dr. Roseanne Reno). During the interview, the neuropsychologist will speak with you and the individual you brought to the appointment.    The second part of  the evaluation is testing with the doctor's technician Annabelle Harman or Selena Batten). During the testing, the technician will ask you to remember different types of material, solve problems, and answer some questionnaires. Your family member will not be present for this portion of the evaluation.   Please note: We must reserve  several hours of the neuropsychologist's time and the psychometrician's time for your evaluation appointment. As such, there is a No-Show fee of $100. If you are unable to attend any of your appointments, please contact our office as soon as possible to reschedule.    FALL PRECAUTIONS: Be cautious when walking. Scan the area for obstacles that may increase the risk of trips and falls. When getting up in the mornings, sit up at the edge of the bed for a few minutes before getting out of bed. Consider elevating the bed at the head end to avoid drop of blood pressure when getting up. Walk always in a well-lit room (use night lights in the walls). Avoid area rugs or power cords from appliances in the middle of the walkways. Use a walker or a cane if necessary and consider physical therapy for balance exercise. Get your eyesight checked regularly.  FINANCIAL OVERSIGHT: Supervision, especially oversight when making financial decisions or transactions is also recommended.  HOME SAFETY: Consider the safety of the kitchen when operating appliances like stoves, microwave oven, and blender. Consider having supervision and share cooking responsibilities until no longer able to participate in those. Accidents with firearms and other hazards in the house should be identified and addressed as well.   ABILITY TO BE LEFT ALONE: If patient is unable to contact 911 operator, consider using LifeLine, or when the need is there, arrange for someone to stay with patients. Smoking is a fire hazard, consider supervision or cessation. Risk of wandering should be assessed by caregiver and if detected at any point, supervision and safe proof recommendations should be instituted.  MEDICATION SUPERVISION: Inability to self-administer medication needs to be constantly addressed. Implement a mechanism to ensure safe administration of the medications.   DRIVING: Regarding driving, in patients with progressive memory problems, driving  will be impaired. We advise to have someone else do the driving if trouble finding directions or if minor accidents are reported. Independent driving assessment is available to determine safety of driving.   If you are interested in the driving assessment, you can contact the following:  [facility] [employer] [facility] in [place] [phone]  [facility] [phone]  [facility] [phone] or [phone]    Mediterranean Diet A Mediterranean diet refers to food and lifestyle choices that are based on the traditions of countries located on the [employer]. This way of eating has been shown to help prevent certain conditions and improve outcomes for people who have chronic diseases, like kidney disease and heart disease. What are tips for following this plan? Lifestyle  Cook and eat meals together with your family, when possible. Drink enough fluid to keep your urine clear or pale yellow. Be physically active every day. This includes: Aerobic exercise like running or swimming. Leisure activities like gardening, walking, or housework. Get 7-8 hours of sleep each night. If recommended by your health care provider, drink red wine in moderation. This means 1 glass a day for nonpregnant women and 2 glasses a day for men. A glass of wine equals 5 oz (150 mL). Reading food labels  Check the serving size of packaged foods. For foods such as rice  and pasta, the serving size refers to the amount of cooked product, not dry. Check the total fat in packaged foods. Avoid foods that have saturated fat or trans fats. Check the ingredients list for added sugars, such as corn syrup. Shopping  At the grocery store, buy most of your food from the areas near the walls of the store. This includes: Fresh fruits and vegetables (produce). Grains, beans, nuts, and seeds. Some of these may be available in unpackaged forms or large amounts (in  bulk). Fresh seafood. Poultry and eggs. Low-fat dairy products. Buy whole ingredients instead of prepackaged foods. Buy fresh fruits and vegetables in-season from local farmers markets. Buy frozen fruits and vegetables in resealable bags. If you do not have access to quality fresh seafood, buy precooked frozen shrimp or canned fish, such as tuna, salmon, or sardines. Buy small amounts of raw or cooked vegetables, salads, or olives from the deli or salad bar at your store. Stock your pantry so you always have certain foods on hand, such as olive oil, canned tuna, canned tomatoes, rice, pasta, and beans. Cooking  Cook foods with extra-virgin olive oil instead of using butter or other vegetable oils. Have meat as a side dish, and have vegetables or grains as your main dish. This means having meat in small portions or adding small amounts of meat to foods like pasta or stew. Use beans or vegetables instead of meat in common dishes like chili or lasagna. Experiment with different cooking methods. Try roasting or broiling vegetables instead of steaming or sauteing them. Add frozen vegetables to soups, stews, pasta, or rice. Add nuts or seeds for added healthy fat at each meal. You can add these to yogurt, salads, or vegetable dishes. Marinate fish or vegetables using olive oil, lemon juice, garlic, and fresh herbs. Meal planning  Plan to eat 1 vegetarian meal one day each week. Try to work up to 2 vegetarian meals, if possible. Eat seafood 2 or more times a week. Have healthy snacks readily available, such as: Vegetable sticks with hummus. Greek yogurt. Fruit and nut trail mix. Eat balanced meals throughout the week. This includes: Fruit: 2-3 servings a day Vegetables: 4-5 servings a day Low-fat dairy: 2 servings a day Fish, poultry, or lean meat: 1 serving a day Beans and legumes: 2 or more servings a week Nuts and seeds: 1-2 servings a day Whole grains: 6-8 servings a day Extra-virgin  olive oil: 3-4 servings a day Limit red meat and sweets to only a few servings a month What are my food choices? Mediterranean diet Recommended Grains: Whole-grain pasta. Brown rice. Bulgar wheat. Polenta. Couscous. Whole-wheat bread. Orpah Cobb. Vegetables: Artichokes. Beets. Broccoli. Cabbage. Carrots. Eggplant. Green beans. Chard. Kale. Spinach. Onions. Leeks. Peas. Squash. Tomatoes. Peppers. Radishes. Fruits: Apples. Apricots. Avocado. Berries. Bananas. Cherries. Dates. Figs. Grapes. Lemons. Melon. Oranges. Peaches. Plums. Pomegranate. Meats and other protein foods: Beans. Almonds. Sunflower seeds. Pine nuts. Peanuts. Cod. Salmon. Scallops. Shrimp. Tuna. Tilapia. Clams. Oysters. Eggs. Dairy: Low-fat milk. Cheese. Greek yogurt. Beverages: Water. Red wine. Herbal tea. Fats and oils: Extra virgin olive oil. Avocado oil. Grape seed oil. Sweets and desserts: [place] yogurt with honey. Baked apples. Poached pears. Trail mix. Seasoning and other foods: Basil. Cilantro. Coriander. Cumin. Mint. Parsley. Sage. Rosemary. Tarragon. Garlic. Oregano. Thyme. Pepper. Balsalmic vinegar. Tahini. Hummus. Tomato sauce. Olives. Mushrooms. Limit these Grains: Prepackaged pasta or rice dishes. Prepackaged cereal with added sugar. Vegetables: Deep fried potatoes (french fries). Fruits: Fruit canned in syrup. Meats and other  protein foods: Beef. Pork. Lamb. Poultry with skin. Hot dogs. Tomasa Blase. Dairy: Ice cream. Sour cream. Whole milk. Beverages: Juice. Sugar-sweetened soft drinks. Beer. Liquor and spirits. Fats and oils: Butter. Canola oil. Vegetable oil. Beef fat (tallow). Lard. Sweets and desserts: Cookies. Cakes. Pies. Candy. Seasoning and other foods: Mayonnaise. Premade sauces and marinades. The items listed may not be a complete list. Talk with your dietitian about what dietary choices are right for you. Summary The Mediterranean diet includes both food and lifestyle choices. Eat a variety of fresh  fruits and vegetables, beans, nuts, seeds, and whole grains. Limit the amount of red meat and sweets that you eat. Talk with your health care provider about whether it is safe for you to drink red wine in moderation. This means 1 glass a day for nonpregnant women and 2 glasses a day for men. A glass of wine equals 5 oz (150 mL). This information is not intended to replace advice given to you by your health care provider. Make sure you discuss any questions you have with your health care provider. Document Released: 01/20/2016 Document Revised: 02/22/2016 Document Reviewed: 01/20/2016 Elsevier Interactive Patient Education  2017 [employer].

## 2023-05-01 NOTE — Progress Notes (Signed)
Assessment/Plan:    Mixed Alzheimer's and Vascular Dementia, mild   Monique Patterson is a very pleasant 72 y.o. RH female with a history of anxiety, depression, hearing loss, prior history of ulcerative colitis and a diagnosis of  mild mixed Alzheimer's and Vascular dementia as per neuropsych evaluation seen today in follow up for memory loss. Patient is not on antidementia medication. Given her diagnosis, we discussed starting an agent.  Due to history of UC, and prior side effects with donepezil, she is willing to try memantine. Patient is able to participate on his ADLs and to drive without difficulties   Recommendations   Follow up in 4  months. Start memantine 5 mg bid, side effects discussed (history of U.C) Recommend good control of her cardiovascular risk factors Continue to control mood as per [facility]. Continue Effexor.     Subjective:    This patient is accompanied in the office by her husband  who lives in [place] who supplements the history.  Previous records as well as any outside records available were reviewed prior to todays visit. Patient was last seen on 04/19/2022 with [facility] 20/30    Any changes in memory since last visit? " Wonderful"-husband is concerned about her memory.. She tries to go to [place] and socialize. She likes to do crossword puzzles and  word finding repeats oneself?  Endorsed Disoriented when walking into a room?  Patient denies   Leaving objects?  May misplace things but not in unusual places  Wandering behavior?  denies   Any personality changes since last visit? She has moments of irritability. Any worsening depression?:  Denies.  She is under [facility] follow up. Hallucinations or paranoia?  Denies.    Seizures? denies    Any sleep changes?  Sleeps well. Denies vivid dreams, REM behavior or sleepwalking   Sleep apnea?   Denies.   Any hygiene concerns? Denies.  Independent of bathing and dressing?  Endorsed  Does the patient needs help with  medications?  Patient  is in charge   Who is in charge of the finances?  Patient is in charge     Any changes in appetite?  Denies.     Patient have trouble swallowing? Denies.   Does the patient cook? Yes , denies forgetting common recipes.  Any headaches?   Denies.   Chronic back pain  denies.   Ambulates with difficulty? Denies. I walk with my rescue dog    Recent falls or head injuries? Denies.     Unilateral weakness, numbness or tingling? Denies.   Any tremors?  Denies   Any anosmia?  Denies   Any incontinence of urine?  Denies  Any bowel dysfunction?   Denies      Patient lives with  husband Does the patient drive? She continues to drive, denies getting lost    Neuropsych evaluation  04/2023  Briefly, results suggested severe impairment surrounding all aspects of learning and memory. Additional severe impairments were exhibited across semantic fluency and cognitive flexibility, while performance variability was exhibited across confrontation naming and visuospatial abilities. Regarding the cause of her mild dementia presentation, I have primary concerns surrounding underlying Alzheimer's disease. Across memory testing, [name] did not benefit from repeated exposure to novel information, was fully amnestic (i.e., 0% retention) after a brief delay across all three memory tasks, and performed very poorly across yes/no recognition trials. This suggests evidence for rapid forgetting and a pronounced storage impairment, which is the hallmark testing pattern for this illness.  Further deficits surrounding semantic fluency, confrontation naming, cognitive flexibility, and visuospatial abilities also follow very typical disease progression, escalating these concerns. Continued medical monitoring will be important moving forward.        PREVIOUS MEDICATIONS: donepezil (nightmares)   Initial visit 04/2022 How long did patient have memory difficulties?  Patient had an MMSE performed by her PCP  on April of this year yielding a score of 24/30. Daughter report that she tried donepezil but developed nightmares so she held off.  Patient denies any significant changes in her memory, and instead, she does report that after COVID pandemic, she is still trying to adjust and socialize as before.  At this time, she likes to go to church, enjoys pursuing, likes to do crossword puzzles and word finding.  Her daughter reports that she is "slow at getting back to do things that give her joy "  Repeats oneself? Denies but daughter reports that occasionally repeats herself Disoriented when walking into a room?  Patient denies   Leaving objects in unusual places?  Patient denies   Patient lives husband.   Ambulates  with difficulty?  Does Silver Sneakers  Recent falls?  Patient denies   Any head injuries?  She fell in the bathroom 2 y ago and her CT was normal.  No other head injuries or major falls.  No loss of. History of seizures?   Patient denies   Wandering behavior?  Patient denies   Patient drives? No issues Any mood changes ?  Daughter reports that she is more withdrawn, reclusive, and she relies more on physical than before.  At times, she lashes at her kids, and this has been difficult for her daughter and son, because it is "not like her".   Any depression?:  She has a history of anxiety and depression, not currently behavioral therapy, but she is on Effexor. Lost her parents and other people during the last 2 years which has been hard for her.  She is planning to go back to behavioral therapy which may be of help. Hallucinations?  Patient denies   Paranoia?  Patient denies, but anxious about things.  Patient's mother "braced her with the worse case scenario, and this has taken a toll on her fears   " Patient reports that sleeps well without vivid dreams, REM behavior or sleepwalking. Since off Prozac she is doing well  History of sleep apnea?  Patient denies   Any hygiene concerns?  Patient  denies   Independent of bathing and dressing?  Endorsed  Does the patient needs help with medications? Patient in charge with pillbox   Who is in charge of the finances?  Patient is in charge   Any changes in appetite?  Patient denies. She skips breakfast when she is angry.    Patient have trouble swallowing? 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  Any kitchen accidents such as leaving the stove on? Patient denies   Any headaches?  Patient denies   Double vision? Patient denies   Any focal numbness or tingling?  Patient denies   Chronic back pain Patient denies   Unilateral weakness?  Patient denies   Any tremors?  Patient denies   Any history of anosmia?  Patient denies   Any incontinence of urine?  Patient denies   Any bowel dysfunction?   Patient denies   History of heavy alcohol intake?  Patient denies   History of heavy tobacco use?  Patient denies  Family history of dementia?   Mo 89  with dementia? Type   RENT MEDICATIONS:  Outpatient Encounter Medications as of 05/01/2023  Medication Sig   memantine (NAMENDA) 5 MG tablet Take 1 tablet (5 mg total) by mouth at bedtime.   venlafaxine XR (EFFEXOR XR) 37.5 MG 24 hr capsule 37.5 mg. Takes 3 tabs daily   No facility-administered encounter medications on file as of 05/01/2023.        No data to display            04/19/2022   10:00 AM  Montreal Cognitive Assessment   Visuospatial/ Executive (0/5) 4  Naming (0/3) 3  Attention: Read list of digits (0/2) 2  Attention: Read list of letters (0/1) 1  Attention: Serial 7 subtraction starting at 100 (0/3) 3  Language: Repeat phrase (0/2) 1  Language : Fluency (0/1) 1  Abstraction (0/2) 1  Delayed Recall (0/5) 0  Orientation (0/6) 4  Total 20  Adjusted Score (based on education) 20    Objective:     PHYSICAL EXAMINATION:    VITALS:   Vitals:   05/01/23 1151  BP: 110/80  Pulse: 74  Resp: 20  SpO2: 98%  Height: 5\' 3"  (1.6 m)     GEN:  The patient appears stated age and is in NAD. HEENT:  Normocephalic, atraumatic.   Neurological examination:  General: NAD, well-groomed, appears stated age. Orientation: The patient is alert. Oriented to person, place and not to date Cranial nerves: There is good facial symmetry.The speech is fluent and clear, tangential at times. No aphasia or dysarthria. Fund of knowledge is appropriate. Recent and remote memory are impaired. Attention and concentration are reduced.  Able to name objects and repeat phrases.  Hearing is intact to conversational tone.   Sensation: Sensation is intact to light touch throughout Motor: Strength is at least antigravity x4. DTR's 2/4 in UE/LE     Movement examination: Tone: There is normal tone in the UE/LE Abnormal movements:  no tremor.  No myoclonus.  No asterixis.   Coordination:  There is no decremation with RAM's. Normal finger to nose  Gait and Station: The patient has no difficulty arising out of a deep-seated chair without the use of the hands. The patient's stride length is good.  Gait is cautious and narrow.    Thank you for allowing [place] the opportunity to participate in the care of this nice patient. Please do not hesitate to contact us for any questions or concerns.   Total time spent on today's visit was 43 minutes dedicated to this patient today, preparing to see patient, examining the patient, ordering tests and/or medications and counseling the patient, documenting clinical information in the EHR or other health record, independently interpreting results and communicating results to the patient/family, discussing treatment and goals, answering patient's questions and coordinating care.  Cc:  Sigmund Hazel, MD  Marlowe Kays 05/01/2023 12:37 PM

## 2023-06-22 ENCOUNTER — Other Ambulatory Visit: Payer: Self-pay | Admitting: Physician Assistant

## 2023-08-30 ENCOUNTER — Ambulatory Visit: Payer: Medicare PPO | Admitting: Physician Assistant

## 2023-09-18 DIAGNOSIS — Z23 Encounter for immunization: Secondary | ICD-10-CM | POA: Diagnosis not present

## 2023-09-18 DIAGNOSIS — N1831 Chronic kidney disease, stage 3a: Secondary | ICD-10-CM | POA: Diagnosis not present

## 2023-09-18 DIAGNOSIS — F0390 Unspecified dementia without behavioral disturbance: Secondary | ICD-10-CM | POA: Diagnosis not present

## 2023-09-18 DIAGNOSIS — E782 Mixed hyperlipidemia: Secondary | ICD-10-CM | POA: Diagnosis not present

## 2023-09-18 DIAGNOSIS — Z6822 Body mass index (BMI) 22.0-22.9, adult: Secondary | ICD-10-CM | POA: Diagnosis not present

## 2023-10-11 ENCOUNTER — Ambulatory Visit: Admitting: Physician Assistant

## 2023-11-06 ENCOUNTER — Ambulatory Visit: Admitting: Physician Assistant

## 2023-11-06 ENCOUNTER — Encounter: Payer: Self-pay | Admitting: Physician Assistant

## 2023-11-06 VITALS — BP 138/85 | HR 79 | Resp 20 | Ht 63.0 in | Wt 121.0 lb

## 2023-11-06 DIAGNOSIS — G309 Alzheimer's disease, unspecified: Secondary | ICD-10-CM | POA: Diagnosis not present

## 2023-11-06 DIAGNOSIS — F02A4 Dementia in other diseases classified elsewhere, mild, with anxiety: Secondary | ICD-10-CM | POA: Diagnosis not present

## 2023-11-06 NOTE — Progress Notes (Signed)
 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 RH female with a history of history of anxiety, depression, hearing loss, prior history of ulcerative colitis and a diagnosis of mild mixed Alzheimer's and Vascular dementia as per neuropsych evaluation seen today in follow up for memory loss. Monique Patterson is on memantine  5 mg twice daily (history of ulcerative colitis).  MMSE today is 19/30.  Discussed increasing memantine  to 10 mg twice daily, Monique Patterson prefers to wait and will call as when she is ready to changing the dose.  She continues to be active, able to participate in her ADLs, and to drive without any issues.  Mood is good.   Follow up in 6 months. Continue memantine  5 mg twice daily, side effects discussed  Recommend good control of her cardiovascular risk factors Continue to control mood as per behavioral health, continue Effexor     Subjective:    This Monique Patterson is accompanied in the office by her husband who supplements the history.  Previous records as well as any outside records available were reviewed prior to todays visit. Monique Patterson was last seen on 05/01/2023.     Any changes in memory since last visit? "About the same".  She tries to go to church and socialize.  She enjoys doing crossword puzzles, word finding. repeats oneself?  Endorsed Disoriented when walking into a room? Denies    Leaving objects?  May misplace things but not in unusual places.   Wandering behavior?  denies   Any personality changes since last visit?  Denies except some moments of irritability as before.   Any worsening depression?:  Denies.   Hallucinations or paranoia?  Denies.   Seizures? denies    Any sleep changes? Sleeps well. Denies vivid dreams, REM behavior or sleepwalking   Sleep apnea?   Denies.   Any hygiene concerns? Denies.  Independent of bathing and dressing?  Endorsed  Does the Monique Patterson needs help with  medications?  Monique Patterson is in charge   Who is in charge of the finances?  Monique Patterson is in charge     Any changes in appetite?  Denies.      Monique Patterson have trouble swallowing? Denies.   Does the Monique Patterson cook?  Yes, she denies forgetting any, recipes. Any headaches?   denies   Any vision changes? Denies   Chronic back pain  denies   Ambulates with difficulty? Denies.  She walks with her rescue dog  Recent falls or head injuries? Denies.     Unilateral weakness, numbness or tingling? denies   Any tremors?  Denies   Any anosmia?  Denies   Any incontinence of urine?  Denies   Any bowel dysfunction?  She does a history of ulcerative colitis, no recent flare     Monique Patterson lives with her husband  Does the Monique Patterson drive?  Yes, she denies getting lost.   Neuropsych evaluation  04/2023  Briefly, results suggested severe impairment surrounding all aspects of learning and memory. Additional severe impairments were exhibited across semantic fluency and cognitive flexibility, while performance variability was exhibited across confrontation naming and visuospatial abilities. Regarding the cause of her mild dementia presentation, I have primary concerns surrounding underlying Alzheimer's disease. Across memory testing, Monique Patterson did not benefit from repeated exposure to novel information, was fully amnestic (i.e., 0% retention) after a brief delay across all three memory tasks, and performed very poorly across yes/no recognition trials. This suggests evidence  for rapid forgetting and a pronounced storage impairment, which is the hallmark testing pattern for this illness. Further deficits surrounding semantic fluency, confrontation naming, cognitive flexibility, and visuospatial abilities also follow very typical disease progression, escalating these concerns. Continued medical monitoring will be important moving forward.         PREVIOUS MEDICATIONS: donepezil (nightmares)    Initial visit 04/2022 How long did  Monique Patterson have memory difficulties?  Monique Patterson had an MMSE performed by her PCP on April of this year yielding a score of 24/30. Daughter report that she tried donepezil but developed nightmares so she held off.  Monique Patterson denies any significant changes in her memory, and instead, she does report that after COVID pandemic, she is still trying to adjust and socialize as before.  At this time, she likes to go to church, enjoys pursuing, likes to do crossword puzzles and word finding.  Her daughter reports that she is "slow at getting back to do things that give her joy "  Repeats oneself? Denies but daughter reports that occasionally repeats herself Disoriented when walking into a room?  Monique Patterson denies   Leaving objects in unusual places?  Monique Patterson denies   Monique Patterson lives husband.   Ambulates  with difficulty?  Does Silver Sneakers  Recent falls?  Monique Patterson denies   Any head injuries?  She fell in the bathroom 2 y ago and her CT was normal.  No other head injuries or major falls.  No loss of. History of seizures?   Monique Patterson denies   Wandering behavior?  Monique Patterson denies   Monique Patterson drives? No issues Any mood changes ?  Daughter reports that she is more withdrawn, reclusive, and she relies more on physical than before.  At times, she lashes at her kids, and this has been difficult for her daughter and son, because it is "not like her".   Any depression?:  She has a history of anxiety and depression, not currently behavioral therapy, but she is on Effexor. Lost her parents and other people during the last 2 years which has been hard for her.  She is planning to go back to behavioral therapy which may be of help. Hallucinations?  Monique Patterson denies   Paranoia?  Monique Patterson denies, but anxious about things.  Monique Patterson's mother "braced her with the worse case scenario, and this has taken a toll on her fears   " Monique Patterson reports that sleeps well without vivid dreams, REM behavior or sleepwalking. Since off Prozac she is doing well   History of sleep apnea?  Monique Patterson denies   Any hygiene concerns?  Monique Patterson denies   Independent of bathing and dressing?  Endorsed  Does the Monique Patterson needs help with medications? Monique Patterson in charge with pillbox   Who is in charge of the finances?  Monique Patterson is in charge   Any changes in appetite?  Monique Patterson denies. She skips breakfast when she is angry.    Monique Patterson have trouble swallowing? 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  Any kitchen accidents such as leaving the stove on? Monique Patterson denies   Any headaches?  Monique Patterson denies   Double vision? Monique Patterson denies   Any focal numbness or tingling?  Monique Patterson denies   Chronic back pain Monique Patterson denies   Unilateral weakness?  Monique Patterson denies   Any tremors?  Monique Patterson denies   Any history of anosmia?  Monique Patterson denies   Any incontinence of urine?  Monique Patterson denies   Any bowel dysfunction?   Monique Patterson denies  History of heavy alcohol intake?  Monique Patterson denies   History of heavy tobacco use?  Monique Patterson denies   Family history of dementia?   Mo 89  with dementia? Type PREVIOUS MEDICATIONS:   CURRENT MEDICATIONS:  Outpatient Encounter Medications as of 11/06/2023  Medication Sig   atorvastatin (LIPITOR) 20 MG tablet Take 20 mg by mouth daily.   cyanocobalamin  (VITAMIN B12) 100 MCG tablet Take 100 mcg by mouth daily.   memantine  (NAMENDA ) 5 MG tablet TAKE 1 TABLET BY MOUTH EVERYDAY AT BEDTIME   venlafaxine XR (EFFEXOR XR) 37.5 MG 24 hr capsule 37.5 mg. Takes 3 tabs daily   No facility-administered encounter medications on file as of 11/06/2023.       11/06/2023   10:00 AM  MMSE - Mini Mental State Exam  Orientation to time 2  Orientation to Place 2  Registration 3  Attention/ Calculation 4  Recall 0  Language- name 2 objects 1  Language- repeat 1  Language- follow 3 step command 3  Language- read & follow direction 1  Write a sentence 1  Copy design 1  Total score 19      04/19/2022   10:00 AM  Montreal Cognitive Assessment    Visuospatial/ Executive (0/5) 4  Naming (0/3) 3  Attention: Read list of digits (0/2) 2  Attention: Read list of letters (0/1) 1  Attention: Serial 7 subtraction starting at 100 (0/3) 3  Language: Repeat phrase (0/2) 1  Language : Fluency (0/1) 1  Abstraction (0/2) 1  Delayed Recall (0/5) 0  Orientation (0/6) 4  Total 20  Adjusted Score (based on education) 20    Objective:     PHYSICAL EXAMINATION:    VITALS:   Vitals:   11/06/23 0916  BP: 138/85  Pulse: 79  Resp: 20  SpO2: 98%  Weight: 121 lb (54.9 kg)  Height: 5\' 3"  (1.6 m)    GEN:  The Monique Patterson appears stated age and is in NAD. HEENT:  Normocephalic, atraumatic.   Neurological examination:  General: NAD, well-groomed, appears stated age. Orientation: The Monique Patterson is alert. Oriented to person, not to place and date Cranial nerves: There is good facial symmetry.The speech is fluent and clear. No aphasia or dysarthria. Fund of knowledge is appropriate. Recent and remote memory are impaired. Attention and concentration are reduced. Able to name objects and repeat phrases.  Hearing is intact to conversational tone. Sensation: Sensation is intact to light touch throughout Motor: Strength is at least antigravity x4. DTR's 2/4 in UE/LE     Movement examination: Tone: There is normal tone in the UE/LE Abnormal movements:  no tremor.  No myoclonus.  No asterixis.   Coordination:  There is no decremation with RAM's. Normal finger to nose  Gait and Station: The Monique Patterson has no difficulty arising out of a deep-seated chair without the use of the hands. The Monique Patterson's stride length is good.  Gait is cautious and narrow.    Thank you for allowing us  the opportunity to participate in the care of this nice Monique Patterson. Please do not hesitate to contact us  for any questions or concerns.   Total time spent on today's visit was 25 minutes dedicated to this Monique Patterson today, preparing to see Monique Patterson, examining the Monique Patterson, ordering tests  and/or medications and counseling the Monique Patterson, documenting clinical information in the EHR or other health record, independently interpreting results and communicating results to the Monique Patterson/family, discussing treatment and goals, answering Monique Patterson's questions and coordinating care.  Cc:  Perley Bradley,  MD  Tex Filbert 11/06/2023 10:32 AM

## 2023-11-06 NOTE — Patient Instructions (Addendum)
 It was a pleasure to see you today at our office.   Recommendations:  Follow up in 6 months  Continue memantine  1 tablet twice daily.   Whom to call:  Memory  decline, memory medications: Call our office [phone]   For psychiatric meds, mood meds: Please have your primary care physician manage these medications.   Counseling regarding caregiver distress, including caregiver depression, anxiety and issues regarding community resources, adult day care programs, adult living facilities, or memory care questions:   Feel free to contact Misty Court Distance, Social Worker at [phone]   For assessment of decision of mental capacity and competency:  Call Dr. Laverne Potter, geriatric psychiatrist at [phone]  For guidance in geriatric dementia issues please call [facility] [phone]  For guidance regarding [facility] and if placement were needed at the facility, contact Forrestine Ike, Social Worker tel: [phone]  If you have any severe symptoms of a stroke, or other severe issues such as confusion,severe chills or fever, etc call 911 or go to the ER as you may need to be evaluated further   Feel free to visit Facebook page " [email or web address]" for tips of how to care for people with memory problems.      RECOMMENDATIONS FOR ALL PATIENTS WITH MEMORY PROBLEMS: 1. Continue to exercise (Recommend 30 minutes of walking everyday, or 3 hours every week) 2. Increase social interactions - continue going to [place] and enjoy social gatherings with friends and family 3. Eat healthy, avoid fried foods and eat more fruits and vegetables 4. Maintain adequate blood pressure, blood sugar, and blood cholesterol level. Reducing the risk of stroke and cardiovascular disease also helps promoting better memory. 5. Avoid stressful situations. Live a simple life and avoid aggravations. Organize your time and prepare for the next day in anticipation. 6. Sleep well, avoid  any interruptions of sleep and avoid any distractions in the bedroom that may interfere with adequate sleep quality 7. Avoid sugar, avoid sweets as there is a strong link between excessive sugar intake, diabetes, and cognitive impairment We discussed the Mediterranean diet, which has been shown to help patients reduce the risk of progressive memory disorders and reduces cardiovascular risk. This includes eating fish, eat fruits and green leafy vegetables, nuts like almonds and hazelnuts, walnuts, and also use olive oil. Avoid fast foods and fried foods as much as possible. Avoid sweets and sugar as sugar use has been linked to worsening of memory function.  There is always a concern of gradual progression of memory problems. If this is the case, then we may need to adjust level of care according to patient needs. Support, both to the patient and caregiver, should then be put into place.      You have been referred for a neuropsychological evaluation (i.e., evaluation of memory and thinking abilities). Please bring someone with you to this appointment if possible, as it is helpful for the doctor to hear from both you and another adult who knows you well. Please bring eyeglasses and hearing aids if you wear them.    The evaluation will take approximately 3 hours and has two parts:   The first part is a clinical interview with the neuropsychologist (Dr. Kitty Perkins or Dr. Annette Barters). During the interview, the neuropsychologist will speak with you and the individual you brought to the appointment.    The second part of the evaluation is testing with the doctor's technician Bernabe Brew or Burdette Carolin). During the testing, the technician will ask you  to remember different types of material, solve problems, and answer some questionnaires. Your family member will not be present for this portion of the evaluation.   Please note: We must reserve several hours of the neuropsychologist's time and the psychometrician's time for your  evaluation appointment. As such, there is a No-Show fee of $100. If you are unable to attend any of your appointments, please contact our office as soon as possible to reschedule.    FALL PRECAUTIONS: Be cautious when walking. Scan the area for obstacles that may increase the risk of trips and falls. When getting up in the mornings, sit up at the edge of the bed for a few minutes before getting out of bed. Consider elevating the bed at the head end to avoid drop of blood pressure when getting up. Walk always in a well-lit room (use night lights in the walls). Avoid area rugs or power cords from appliances in the middle of the walkways. Use a walker or a cane if necessary and consider physical therapy for balance exercise. Get your eyesight checked regularly.  FINANCIAL OVERSIGHT: Supervision, especially oversight when making financial decisions or transactions is also recommended.  HOME SAFETY: Consider the safety of the kitchen when operating appliances like stoves, microwave oven, and blender. Consider having supervision and share cooking responsibilities until no longer able to participate in those. Accidents with firearms and other hazards in the house should be identified and addressed as well.   ABILITY TO BE LEFT ALONE: If patient is unable to contact 911 operator, consider using LifeLine, or when the need is there, arrange for someone to stay with patients. Smoking is a fire hazard, consider supervision or cessation. Risk of wandering should be assessed by caregiver and if detected at any point, supervision and safe proof recommendations should be instituted.  MEDICATION SUPERVISION: Inability to self-administer medication needs to be constantly addressed. Implement a mechanism to ensure safe administration of the medications.   DRIVING: Regarding driving, in patients with progressive memory problems, driving will be impaired. We advise to have someone else do the driving if trouble finding  directions or if minor accidents are reported. Independent driving assessment is available to determine safety of driving.   If you are interested in the driving assessment, you can contact the following:  [facility] [employer] [facility] in [place] [phone]  [facility] [phone]  [facility] [phone] or [phone]    Mediterranean Diet A Mediterranean diet refers to food and lifestyle choices that are based on the traditions of countries located on the [employer]. This way of eating has been shown to help prevent certain conditions and improve outcomes for people who have chronic diseases, like kidney disease and heart disease. What are tips for following this plan? Lifestyle  Cook and eat meals together with your family, when possible. Drink enough fluid to keep your urine clear or pale yellow. Be physically active every day. This includes: Aerobic exercise like running or swimming. Leisure activities like gardening, walking, or housework. Get 7-8 hours of sleep each night. If recommended by your health care provider, drink red wine in moderation. This means 1 glass a day for nonpregnant women and 2 glasses a day for men. A glass of wine equals 5 oz (150 mL). Reading food labels  Check the serving size of packaged foods. For foods such as rice and pasta, the serving size refers to the amount of cooked product, not dry. Check the total fat in  packaged foods. Avoid foods that have saturated fat or trans fats. Check the ingredients list for added sugars, such as corn syrup. Shopping  At the grocery store, buy most of your food from the areas near the walls of the store. This includes: Fresh fruits and vegetables (produce). Grains, beans, nuts, and seeds. Some of these may be available in unpackaged forms or large amounts (in bulk). Fresh seafood. Poultry and eggs. Low-fat dairy products. Buy whole  ingredients instead of prepackaged foods. Buy fresh fruits and vegetables in-season from local farmers markets. Buy frozen fruits and vegetables in resealable bags. If you do not have access to quality fresh seafood, buy precooked frozen shrimp or canned fish, such as tuna, salmon, or sardines. Buy small amounts of raw or cooked vegetables, salads, or olives from the deli or salad bar at your store. Stock your pantry so you always have certain foods on hand, such as olive oil, canned tuna, canned tomatoes, rice, pasta, and beans. Cooking  Cook foods with extra-virgin olive oil instead of using butter or other vegetable oils. Have meat as a side dish, and have vegetables or grains as your main dish. This means having meat in small portions or adding small amounts of meat to foods like pasta or stew. Use beans or vegetables instead of meat in common dishes like chili or lasagna. Experiment with different cooking methods. Try roasting or broiling vegetables instead of steaming or sauteing them. Add frozen vegetables to soups, stews, pasta, or rice. Add nuts or seeds for added healthy fat at each meal. You can add these to yogurt, salads, or vegetable dishes. Marinate fish or vegetables using olive oil, lemon juice, garlic, and fresh herbs. Meal planning  Plan to eat 1 vegetarian meal one day each week. Try to work up to 2 vegetarian meals, if possible. Eat seafood 2 or more times a week. Have healthy snacks readily available, such as: Vegetable sticks with hummus. Greek yogurt. Fruit and nut trail mix. Eat balanced meals throughout the week. This includes: Fruit: 2-3 servings a day Vegetables: 4-5 servings a day Low-fat dairy: 2 servings a day Fish, poultry, or lean meat: 1 serving a day Beans and legumes: 2 or more servings a week Nuts and seeds: 1-2 servings a day Whole grains: 6-8 servings a day Extra-virgin olive oil: 3-4 servings a day Limit red meat and sweets to only a few servings  a month What are my food choices? Mediterranean diet Recommended Grains: Whole-grain pasta. Brown rice. Bulgar wheat. Polenta. Couscous. Whole-wheat bread. Dwyane Glad. Vegetables: Artichokes. Beets. Broccoli. Cabbage. Carrots. Eggplant. Green beans. Chard. Kale. Spinach. Onions. Leeks. Peas. Squash. Tomatoes. Peppers. Radishes. Fruits: Apples. Apricots. Avocado. Berries. Bananas. Cherries. Dates. Figs. Grapes. Lemons. Melon. Oranges. Peaches. Plums. Pomegranate. Meats and other protein foods: Beans. Almonds. Sunflower seeds. Pine nuts. Peanuts. Cod. Salmon. Scallops. Shrimp. Tuna. Tilapia. Clams. Oysters. Eggs. Dairy: Low-fat milk. Cheese. Greek yogurt. Beverages: Water. Red wine. Herbal tea. Fats and oils: Extra virgin olive oil. Avocado oil. Grape seed oil. Sweets and desserts: [place] yogurt with honey. Baked apples. Poached pears. Trail mix. Seasoning and other foods: Basil. Cilantro. Coriander. Cumin. Mint. Parsley. Sage. Rosemary. Tarragon. Garlic. Oregano. Thyme. Pepper. Balsalmic vinegar. Tahini. Hummus. Tomato sauce. Olives. Mushrooms. Limit these Grains: Prepackaged pasta or rice dishes. Prepackaged cereal with added sugar. Vegetables: Deep fried potatoes (french fries). Fruits: Fruit canned in syrup. Meats and other protein foods: Beef. Pork. Lamb. Poultry with skin. Hot dogs. Helene Loader. Dairy: Ice cream. Sour cream. Whole milk. Beverages:  Juice. Sugar-sweetened soft drinks. Beer. Liquor and spirits. Fats and oils: Butter. Canola oil. Vegetable oil. Beef fat (tallow). Lard. Sweets and desserts: Cookies. Cakes. Pies. Candy. Seasoning and other foods: Mayonnaise. Premade sauces and marinades. The items listed may not be a complete list. Talk with your dietitian about what dietary choices are right for you. Summary The Mediterranean diet includes both food and lifestyle choices. Eat a variety of fresh fruits and vegetables, beans, nuts, seeds, and whole grains. Limit the amount of  red meat and sweets that you eat. Talk with your health care provider about whether it is safe for you to drink red wine in moderation. This means 1 glass a day for nonpregnant women and 2 glasses a day for men. A glass of wine equals 5 oz (150 mL). This information is not intended to replace advice given to you by your health care provider. Make sure you discuss any questions you have with your health care provider. Document Released: 01/20/2016 Document Revised: 02/22/2016 Document Reviewed: 01/20/2016 Elsevier Interactive Patient Education  2017 [employer].

## 2023-11-26 DIAGNOSIS — L814 Other melanin hyperpigmentation: Secondary | ICD-10-CM | POA: Diagnosis not present

## 2023-11-26 DIAGNOSIS — L821 Other seborrheic keratosis: Secondary | ICD-10-CM | POA: Diagnosis not present

## 2023-11-26 DIAGNOSIS — D1801 Hemangioma of skin and subcutaneous tissue: Secondary | ICD-10-CM | POA: Diagnosis not present

## 2023-11-27 ENCOUNTER — Other Ambulatory Visit: Payer: Self-pay | Admitting: Physician Assistant

## 2023-11-27 ENCOUNTER — Telehealth: Payer: Self-pay | Admitting: Physician Assistant

## 2023-11-27 MED ORDER — MEMANTINE HCL 10 MG PO TABS: 10.0000 mg | ORAL_TABLET | Freq: Two times a day (BID) | ORAL | 3 refills | Status: AC

## 2023-11-27 NOTE — Telephone Encounter (Signed)
 Pt. Husband calling per Tex Filbert to get memantine  (NAMENDA ) dosage moved up to 10 MG tablets, please put script in at  [facility] [address]

## 2023-12-17 DIAGNOSIS — R030 Elevated blood-pressure reading, without diagnosis of hypertension: Secondary | ICD-10-CM | POA: Diagnosis not present

## 2023-12-17 DIAGNOSIS — R519 Headache, unspecified: Secondary | ICD-10-CM | POA: Diagnosis not present

## 2023-12-28 DIAGNOSIS — N1831 Chronic kidney disease, stage 3a: Secondary | ICD-10-CM | POA: Diagnosis not present

## 2023-12-28 DIAGNOSIS — R519 Headache, unspecified: Secondary | ICD-10-CM | POA: Diagnosis not present

## 2024-01-01 ENCOUNTER — Other Ambulatory Visit: Payer: Self-pay | Admitting: Physician Assistant

## 2024-01-01 DIAGNOSIS — R519 Headache, unspecified: Secondary | ICD-10-CM

## 2024-01-09 DIAGNOSIS — E871 Hypo-osmolality and hyponatremia: Secondary | ICD-10-CM | POA: Diagnosis not present

## 2024-01-14 ENCOUNTER — Ambulatory Visit
Admission: RE | Admit: 2024-01-14 | Discharge: 2024-01-14 | Disposition: A | Discharge status: Home / Self Care | Source: Ambulatory Visit | Attending: Physician Assistant | Admitting: Physician Assistant

## 2024-01-14 DIAGNOSIS — R519 Headache, unspecified: Secondary | ICD-10-CM

## 2024-01-14 DIAGNOSIS — G43909 Migraine, unspecified, not intractable, without status migrainosus: Secondary | ICD-10-CM | POA: Diagnosis not present

## 2024-01-14 DIAGNOSIS — G319 Degenerative disease of nervous system, unspecified: Secondary | ICD-10-CM | POA: Diagnosis not present

## 2024-02-26 DIAGNOSIS — Z0000 Encounter for general adult medical examination without abnormal findings: Secondary | ICD-10-CM | POA: Diagnosis not present

## 2024-02-26 DIAGNOSIS — E782 Mixed hyperlipidemia: Secondary | ICD-10-CM | POA: Diagnosis not present

## 2024-02-26 DIAGNOSIS — F0390 Unspecified dementia without behavioral disturbance: Secondary | ICD-10-CM | POA: Diagnosis not present

## 2024-02-26 DIAGNOSIS — Z6821 Body mass index (BMI) 21.0-21.9, adult: Secondary | ICD-10-CM | POA: Diagnosis not present

## 2024-02-26 DIAGNOSIS — R519 Headache, unspecified: Secondary | ICD-10-CM | POA: Diagnosis not present

## 2024-02-26 DIAGNOSIS — F3342 Major depressive disorder, recurrent, in full remission: Secondary | ICD-10-CM | POA: Diagnosis not present

## 2024-02-26 DIAGNOSIS — F419 Anxiety disorder, unspecified: Secondary | ICD-10-CM | POA: Diagnosis not present

## 2024-03-23 ENCOUNTER — Other Ambulatory Visit: Payer: Self-pay | Admitting: Physician Assistant

## 2024-05-13 ENCOUNTER — Ambulatory Visit: Admitting: Physician Assistant

## 2024-07-07 NOTE — Progress Notes (Incomplete)
 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 RH female with a history of anxiety, depression, hearing loss, prior history of ulcerative colitis and a diagnosis of mild mixed Alzheimer's and Vascular dementia as per neuropsych evaluation seen today in follow up for memory loss. Patient is currently on memantine  5 mg twice daily (by choice, as she was offered to increase it during her last visit in May 2025).   Patient was last seen on [id number]***. Memory is ***. MMSE today is  /30. Patient is able to participate on ADLs and continues to drive without difficulties. Mood is*** . This patient is accompanied in the office by her husband*** who supplements the history.  Previous records as well as any outside records available were reviewed prior to todays visit.   Follow up in  months Continue memantine  5 mg twice daily, side effects discussed Recommend good control of cardiovascular risk factors.   Continue to control mood as per behavioral health, continue Effexor   Discussed the use of AI scribe software for clinical note transcription with the patient, who gave verbal consent to proceed.  History of Present Illness       Any changes in memory since last visit? About the same.  She tries to go to church and socialize.  She enjoys doing crossword puzzles, word finding. repeats oneself?  Endorsed Disoriented when walking into a room? Denies    Leaving objects?  May misplace things but not in unusual places.   Wandering behavior?  denies   Any personality changes since last visit?  Denies except some moments of irritability as before.   Any worsening depression?:  Denies.   Hallucinations or paranoia?  Denies.   Seizures? denies    Any sleep changes? Sleeps well. Denies vivid dreams, REM behavior or sleepwalking   Sleep apnea?   Denies.   Any hygiene concerns? Denies.  Independent of bathing and dressing?  Endorsed   Does the patient needs help with medications?  Patient is in charge   Who is in charge of the finances?  Patient is in charge     Any changes in appetite?  Denies.      Patient have trouble swallowing? Denies.   Does the patient cook?  Yes, she denies forgetting any, recipes. Any headaches?   denies   Any vision changes? Denies   Chronic back pain  denies   Ambulates with difficulty? Denies.  She walks with her rescue dog  Recent falls or head injuries? Denies.     Unilateral weakness, numbness or tingling? denies   Any tremors?  Denies   Any anosmia?  Denies   Any incontinence of urine?  Denies   Any bowel dysfunction?  She does a history of ulcerative colitis, no recent flare     Patient lives with her husband  Does the patient drive?  Yes, she denies getting lost.    Neuropsych evaluation  04/2023  Briefly, results suggested severe impairment surrounding all aspects of learning and memory. Additional severe impairments were exhibited across semantic fluency and cognitive flexibility, while performance variability was exhibited across confrontation naming and visuospatial abilities. Regarding the cause of her mild dementia presentation, I have primary concerns surrounding underlying Alzheimer's disease. Across memory testing, Ms. Weisensel did not benefit from repeated exposure to novel information, was fully amnestic (i.e., 0% retention) after a brief delay across all three memory tasks, and performed very poorly  across yes/no recognition trials. This suggests evidence for rapid forgetting and a pronounced storage impairment, which is the hallmark testing pattern for this illness. Further deficits surrounding semantic fluency, confrontation naming, cognitive flexibility, and visuospatial abilities also follow very typical disease progression, escalating these concerns. Continued medical monitoring will be important moving forward.         PREVIOUS MEDICATIONS: donepezil (nightmares)     Initial visit 04/2022 How long did patient have memory difficulties?  Patient had an MMSE performed by her PCP on April of this year yielding a score of 24/30. Daughter report that she tried donepezil but developed nightmares so she held off.  Patient denies any significant changes in her memory, and instead, she does report that after COVID pandemic, she is still trying to adjust and socialize as before.  At this time, she likes to go to church, enjoys pursuing, likes to do crossword puzzles and word finding.  Her daughter reports that she is slow at getting back to do things that give her joy   Repeats oneself? Denies but daughter reports that occasionally repeats herself Disoriented when walking into a room?  Patient denies   Leaving objects in unusual places?  Patient denies   Patient lives husband.   Ambulates  with difficulty?  Does Silver Sneakers  Recent falls?  Patient denies   Any head injuries?  She fell in the bathroom 2 y ago and her CT was normal.  No other head injuries or major falls.  No loss of. History of seizures?   Patient denies   Wandering behavior?  Patient denies   Patient drives? No issues Any mood changes ?  Daughter reports that she is more withdrawn, reclusive, and she relies more on physical than before.  At times, she lashes at her kids, and this has been difficult for her daughter and son, because it is not like her.   Any depression?:  She has a history of anxiety and depression, not currently behavioral therapy, but she is on Effexor. Lost her parents and other people during the last 2 years which has been hard for her.  She is planning to go back to behavioral therapy which may be of help. Hallucinations?  Patient denies   Paranoia?  Patient denies, but anxious about things.  Patient's mother braced her with the worse case scenario, and this has taken a toll on her fears    Patient reports that sleeps well without vivid dreams, REM behavior or sleepwalking.  Since off Prozac she is doing well  History of sleep apnea?  Patient denies   Any hygiene concerns?  Patient denies   Independent of bathing and dressing?  Endorsed  Does the patient needs help with medications? Patient in charge with pillbox   Who is in charge of the finances?  Patient is in charge   Any changes in appetite?  Patient denies. She skips breakfast when she is angry.    Patient have trouble swallowing? 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  Any kitchen accidents such as leaving the stove on? Patient denies   Any headaches?  Patient denies   Double vision? Patient denies   Any focal numbness or tingling?  Patient denies   Chronic back pain Patient denies   Unilateral weakness?  Patient denies   Any tremors?  Patient denies   Any history of anosmia?  Patient denies   Any incontinence of urine?  Patient denies   Any  bowel dysfunction?   Patient denies   History of heavy alcohol intake?  Patient denies   History of heavy tobacco use?  Patient denies   Family history of dementia?   Mo 89  with dementia? Type        11/06/2023   10:00 AM  MMSE - Mini Mental State Exam  Orientation to time 2  Orientation to Place 2  Registration 3  Attention/ Calculation 4  Recall 0  Language- name 2 objects 1  Language- repeat 1  Language- follow 3 step command 3  Language- read & follow direction 1  Write a sentence 1  Copy design 1  Total score 19      04/19/2022   10:00 AM  Montreal Cognitive Assessment   Visuospatial/ Executive (0/5) 4  Naming (0/3) 3  Attention: Read list of digits (0/2) 2  Attention: Read list of letters (0/1) 1  Attention: Serial 7 subtraction starting at 100 (0/3) 3  Language: Repeat phrase (0/2) 1  Language : Fluency (0/1) 1  Abstraction (0/2) 1  Delayed Recall (0/5) 0  Orientation (0/6) 4  Total 20  Adjusted Score (based on education) 20      Objective:    Neurological Exam:    VITALS:  There were no vitals  filed for this visit.  GEN:  The patient appears stated age and is in NAD. HEENT:  Normocephalic, atraumatic.   Neurological examination:  General: NAD, well-groomed, appears stated age. Orientation: The patient is alert. Oriented to person, not to place or date cranial nerves: There is good facial symmetry.The speech is fluent and clear. No aphasia or dysarthria. Fund of knowledge is appropriate. Recent and remote memory are impaired. Attention and concentration are reduced. Able to name objects and repeat phrases.  Hearing is intact to conversational tone. *** Sensation: Sensation is intact to light touch throughout Motor: Strength is at least antigravity x4. DTR's 2/4 in UE/LE     Movement examination:  Tone: There is normal tone in the UE/LE Abnormal movements:  no tremor.  No myoclonus.  No asterixis.   Coordination:  There is no decremation with RAM's. Normal finger to nose  Gait and Station: The patient has no*** difficulty arising out of a deep-seated chair without the use of the hands. The patient's stride length is good.  Gait is cautious and narrow.    Thank you for allowing us  the opportunity to participate in the care of this nice patient. Please do not hesitate to contact us  for any questions or concerns.   Total time spent on today's visit was *** minutes dedicated to this patient today, preparing to see patient, examining the patient, ordering tests and/or medications and counseling the patient, documenting clinical information in the EHR or other health record, independently interpreting results and communicating results to the patient/family, discussing treatment and goals, answering patient's questions and coordinating care.  Cc:  Cleotilde Planas, MD  Camie Sevin 07/07/2024 11:14 AM      "

## 2024-07-08 ENCOUNTER — Ambulatory Visit: Admitting: Physician Assistant

## 2024-08-11 ENCOUNTER — Ambulatory Visit: Admitting: Physician Assistant
